# Patient Record
Sex: Female | Born: 1990 | Race: Black or African American | Hispanic: No | Marital: Married | Smoking: Former smoker
Health system: Southern US, Community
[De-identification: ages and names within clinical notes are randomized; demographics above are authoritative.]

## PROBLEM LIST (undated history)

## (undated) DIAGNOSIS — E282 Polycystic ovarian syndrome: Secondary | ICD-10-CM

## (undated) DIAGNOSIS — O139 Gestational [pregnancy-induced] hypertension without significant proteinuria, unspecified trimester: Secondary | ICD-10-CM

## (undated) DIAGNOSIS — E8881 Metabolic syndrome: Secondary | ICD-10-CM

## (undated) DIAGNOSIS — L680 Hirsutism: Secondary | ICD-10-CM

## (undated) DIAGNOSIS — G43909 Migraine, unspecified, not intractable, without status migrainosus: Secondary | ICD-10-CM

## (undated) DIAGNOSIS — O9989 Other specified diseases and conditions complicating pregnancy, childbirth and the puerperium: Secondary | ICD-10-CM

## (undated) DIAGNOSIS — K589 Irritable bowel syndrome without diarrhea: Secondary | ICD-10-CM

## (undated) HISTORY — DX: Metabolic syndrome: E88.810

## (undated) HISTORY — DX: Gestational (pregnancy-induced) hypertension without significant proteinuria, unspecified trimester: O13.9

## (undated) HISTORY — DX: Other specified diseases and conditions complicating pregnancy, childbirth and the puerperium: O99.89

## (undated) HISTORY — DX: Polycystic ovarian syndrome: E28.2

## (undated) HISTORY — DX: Metabolic syndrome: E88.81

## (undated) HISTORY — DX: Hirsutism: L68.0

---

## 2009-02-13 ENCOUNTER — Emergency Department (HOSPITAL_COMMUNITY): Admission: EM | Admit: 2009-02-13 | Discharge: 2009-02-13 | Payer: Self-pay | Admitting: Emergency Medicine

## 2009-07-26 ENCOUNTER — Emergency Department (HOSPITAL_COMMUNITY): Admission: EM | Admit: 2009-07-26 | Discharge: 2009-07-26 | Payer: Self-pay | Admitting: Emergency Medicine

## 2011-01-24 LAB — POCT URINALYSIS DIP (DEVICE)
Glucose, UA: NEGATIVE mg/dL
Nitrite: POSITIVE — AB
Urobilinogen, UA: 0.2 mg/dL (ref 0.0–1.0)

## 2011-01-24 LAB — POCT PREGNANCY, URINE: Preg Test, Ur: NEGATIVE

## 2011-05-29 ENCOUNTER — Emergency Department (HOSPITAL_COMMUNITY)
Admission: EM | Admit: 2011-05-29 | Discharge: 2011-05-29 | Disposition: A | Discharge status: Home / Self Care | Payer: 59 | Attending: Emergency Medicine | Admitting: Emergency Medicine

## 2011-05-29 DIAGNOSIS — R079 Chest pain, unspecified: Secondary | ICD-10-CM | POA: Insufficient documentation

## 2011-05-29 DIAGNOSIS — N644 Mastodynia: Secondary | ICD-10-CM | POA: Insufficient documentation

## 2011-05-29 DIAGNOSIS — E119 Type 2 diabetes mellitus without complications: Secondary | ICD-10-CM | POA: Insufficient documentation

## 2011-05-29 DIAGNOSIS — R51 Headache: Secondary | ICD-10-CM | POA: Insufficient documentation

## 2011-05-29 DIAGNOSIS — Z79899 Other long term (current) drug therapy: Secondary | ICD-10-CM | POA: Insufficient documentation

## 2011-05-29 DIAGNOSIS — R071 Chest pain on breathing: Secondary | ICD-10-CM | POA: Insufficient documentation

## 2011-05-29 LAB — GLUCOSE, CAPILLARY: Glucose-Capillary: 114 mg/dL — ABNORMAL HIGH (ref 70–99)

## 2011-06-05 ENCOUNTER — Inpatient Hospital Stay (INDEPENDENT_AMBULATORY_CARE_PROVIDER_SITE_OTHER)
Admission: RE | Admit: 2011-06-05 | Discharge: 2011-06-05 | Disposition: A | Discharge status: Home / Self Care | Payer: 59 | Source: Ambulatory Visit | Attending: Family Medicine | Admitting: Family Medicine

## 2011-06-05 DIAGNOSIS — J029 Acute pharyngitis, unspecified: Secondary | ICD-10-CM

## 2011-10-22 NOTE — L&D Delivery Note (Signed)
Delivery Note At 2:26 PM a viable female was delivered via Vaginal, Spontaneous Delivery (Presentation: Left Occiput Anterior).  APGAR: 8, 9; weight .  pending Placenta status: Intact, Spontaneous.  Cord: 3 vessels with the following complications: None.  Cord pH: not indicated  Anesthesia:   Episiotomy:  Lacerations: 3rd degree Suture Repair: 3 figure of 8's w/ 4-0 vicryl to L periurethral laceration while red rubber foley in place, 3 separated deep sutures of 2-0 vicryl in P-I-S fashion to anal sphincter,, 3-0 vicryl rapide in standard fashion to midline tear.  Est. Blood Loss (mL): 400  Mom to postpartum.  Baby to nursery-stable.  Crystal Sheppard A. 09/25/2012, 2:57 PM

## 2011-12-27 ENCOUNTER — Inpatient Hospital Stay (HOSPITAL_COMMUNITY): Payer: 59

## 2011-12-27 ENCOUNTER — Encounter (HOSPITAL_COMMUNITY): Payer: Self-pay

## 2011-12-27 ENCOUNTER — Inpatient Hospital Stay (HOSPITAL_COMMUNITY)
Admission: AD | Admit: 2011-12-27 | Discharge: 2011-12-27 | Disposition: A | Discharge status: Home / Self Care | Payer: 59 | Source: Ambulatory Visit | Attending: Obstetrics and Gynecology | Admitting: Obstetrics and Gynecology

## 2011-12-27 DIAGNOSIS — R109 Unspecified abdominal pain: Secondary | ICD-10-CM | POA: Insufficient documentation

## 2011-12-27 DIAGNOSIS — N938 Other specified abnormal uterine and vaginal bleeding: Secondary | ICD-10-CM | POA: Insufficient documentation

## 2011-12-27 DIAGNOSIS — N949 Unspecified condition associated with female genital organs and menstrual cycle: Secondary | ICD-10-CM | POA: Insufficient documentation

## 2011-12-27 HISTORY — DX: Polycystic ovarian syndrome: E28.2

## 2011-12-27 LAB — URINALYSIS, ROUTINE W REFLEX MICROSCOPIC
Bilirubin Urine: NEGATIVE
Ketones, ur: NEGATIVE mg/dL
Nitrite: NEGATIVE
pH: 6.5 (ref 5.0–8.0)

## 2011-12-27 LAB — CBC
HCT: 35.7 % — ABNORMAL LOW (ref 36.0–46.0)
Hemoglobin: 11.6 g/dL — ABNORMAL LOW (ref 12.0–15.0)
MCHC: 32.5 g/dL (ref 30.0–36.0)
WBC: 6.4 10*3/uL (ref 4.0–10.5)

## 2011-12-27 LAB — URINE MICROSCOPIC-ADD ON

## 2011-12-27 MED ORDER — OXYCODONE-ACETAMINOPHEN 5-325 MG PO TABS
1.0000 | ORAL_TABLET | Freq: Once | ORAL | Status: AC
  Administered 2011-12-27: 1 via ORAL
  Filled 2011-12-27: qty 1

## 2011-12-27 NOTE — ED Provider Notes (Signed)
History     Chief Complaint  Patient presents with  . Vaginal Bleeding   HPI Pt is a 21 yo. Felt like she started her period at beginning of week. 2 days ago started having pains in lower abd and pelvis. Looked like period was going away (dark brown) then started having increased bleeding. Started having large blood clots pass. Heavy bleeding continued today. Patient has been having nausea since Wednesday. Periods are irregular. Usually skips a few months. Usually bleeds 5 days, not a lot of pain typically. Last period before now was 2-3 months ago.  OB History    Grav Para Term Preterm Abortions TAB SAB Ect Mult Living   1    1 1           Past Medical History  Diagnosis Date  . Polycystic ovarian disease     History reviewed. No pertinent past surgical history.  Family History  Problem Relation Age of Onset  . Hypertension Mother   . Hypertension Brother   . Diabetes Paternal Grandmother     History  Substance Use Topics  . Smoking status: Former Games developer  . Smokeless tobacco: Not on file  . Alcohol Use: Yes     occasionally    Allergies: No Known Allergies  Prescriptions prior to admission  Medication Sig Dispense Refill  . ibuprofen (ADVIL,MOTRIN) 200 MG tablet Take 800 mg by mouth every 6 (six) hours as needed. Head aches        Review of Systems  Constitutional: Negative for fever and chills.  Respiratory: Negative for shortness of breath.   Cardiovascular: Negative for chest pain.  Gastrointestinal: Negative for nausea and abdominal pain.  Genitourinary: Negative for dysuria.  Musculoskeletal: Positive for back pain.  Skin: Negative for rash.  Neurological: Positive for headaches. Negative for dizziness.   Physical Exam   Blood pressure 157/83, pulse 85, temperature 97.7 F (36.5 C), temperature source Oral, resp. rate 16, height 5' 5.5" (1.664 m), weight 101.515 kg (223 lb 12.8 oz), last menstrual period 12/23/2011, SpO2 100.00%.  Physical Exam    Constitutional: She is oriented to person, place, and time. She appears well-developed and well-nourished. No distress.  HENT:  Head: Normocephalic and atraumatic.  Neck: Normal range of motion.  Cardiovascular: Normal rate and regular rhythm.   No murmur heard. Respiratory: Effort normal and breath sounds normal. She has no wheezes.  GI: Soft.  Musculoskeletal: Normal range of motion. She exhibits no edema and no tenderness.  Lymphadenopathy:    She has no cervical adenopathy.  Neurological: She is alert and oriented to person, place, and time. No cranial nerve deficit.  Skin: Skin is dry. No rash noted.    MAU Course  Procedures Results for orders placed during the hospital encounter of 12/27/11 (from the past 24 hour(s))  URINALYSIS, ROUTINE W REFLEX MICROSCOPIC     Status: Abnormal   Collection Time   12/27/11  5:25 PM      Component Value Range   Color, Urine RED (*) YELLOW    APPearance CLOUDY (*) CLEAR    Specific Gravity, Urine 1.025  1.005 - 1.030    pH 6.5  5.0 - 8.0    Glucose, UA NEGATIVE  NEGATIVE (mg/dL)   Hgb urine dipstick LARGE (*) NEGATIVE    Bilirubin Urine NEGATIVE  NEGATIVE    Ketones, ur NEGATIVE  NEGATIVE (mg/dL)   Protein, ur NEGATIVE  NEGATIVE (mg/dL)   Urobilinogen, UA 0.2  0.0 - 1.0 (mg/dL)   Nitrite  NEGATIVE  NEGATIVE    Leukocytes, UA NEGATIVE  NEGATIVE   URINE MICROSCOPIC-ADD ON     Status: Normal   Collection Time   12/27/11  5:25 PM      Component Value Range   Squamous Epithelial / LPF RARE  RARE    RBC / HPF TOO NUMEROUS TO COUNT  <3 (RBC/hpf)  POCT PREGNANCY, URINE     Status: Normal   Collection Time   12/27/11  5:27 PM      Component Value Range   Preg Test, Ur NEGATIVE  NEGATIVE    MDM Pelvic exam revealed moderate bleeding.  CBC pending Will check non-OB pelvic US Percocet x1 for headache and back ache  Assessment and Plan  21 yo F presenting for vaginal bleeding with occasional abd pain - CBC and vitals within normal limits -  [place] within normal limits - Most likely secondary to dysfunctional uterine bleeding. Patient has irregular periods, this one just seems heavier than usual.  - Will discharge home in stable medical condition.  - Patient will follow up in her Gyn clinic. If her bleeding gets heavier or does not go away, she will be seen earlier. - Plan discussed with Sid Falcon, CNM  Azyiah Bo 12/27/2011, 6:41 PM

## 2011-12-27 NOTE — Progress Notes (Signed)
Patient states she has a history of irregular periods and PCOS. Started bleeding on 3-4 and continues. Has been having abdominal pain on and off, none now, and a bad headache.

## 2011-12-27 NOTE — Discharge Instructions (Signed)
Please call your Ob/GYN provider for an appointment within the next few weeks. If you continue to have heavy bleeding in one week, please see you GYN or return here to be evaluated. You can take Motrin as needed. Abnormal Uterine Bleeding Abnormal uterine bleeding can have many causes. Some cases are simply treated, while others are more serious. There are several kinds of bleeding that is considered abnormal, including:  Bleeding between periods.   Bleeding after sexual intercourse.   Spotting anytime in the menstrual cycle.   Bleeding heavier or more than normal.   Bleeding after menopause.  CAUSES  There are many causes of abnormal uterine bleeding. It can be present in teenagers, pregnant women, women during their reproductive years, and women who have reached menopause. Your caregiver will look for the more common causes depending on your age, signs, symptoms and your particular circumstance. Most cases are not serious and can be treated. Even the more serious causes, like cancer of the female organs, can be treated adequately if found in the early stages. That is why all types of bleeding should be evaluated and treated as soon as possible. DIAGNOSIS  Diagnosing the cause may take several kinds of tests. Your caregiver may:  Take a complete history of the type of bleeding.   Perform a complete physical exam and Pap smear.   Take an ultrasound on the abdomen showing a picture of the female organs and the pelvis.   Inject dye into the uterus and Fallopian tubes and X-ray them (hysterosalpingogram).   Place fluid in the uterus and do an ultrasound (sonohysterogrqphy).   Take a CT scan to examine the female organs and pelvis.   Take an MRI to examine the female organs and pelvis. There is no X-ray involved with this procedure.   Look inside the uterus with a telescope that has a light at the end (hysteroscopy).   Scrap the inside of the uterus to get tissue to examine  (Dilatation and Curettage, D&C).   Look into the pelvis with a telescope that has a light at the end (laparoscopy). This is done through a very small cut (incision) in the abdomen.  TREATMENT  Treatment will depend on the cause of the abnormal bleeding. It can include:  Doing nothing to allow the problem to take care of itself over time.   Hormone treatment.   Birth control pills.   Treating the medical condition causing the problem.   Laparoscopy.   Major or minor surgery   Destroying the lining of the uterus with electrical currant, laser, freezing or heat (uterine ablation).  HOME CARE INSTRUCTIONS   Follow your caregiver's recommendation on how to treat your problem.   See your caregiver if you missed a menstrual period and think you may be pregnant.   If you are bleeding heavily, count the number of pads/tampons you use and how often you have to change them. Tell this to your caregiver.   Avoid sexual intercourse until the problem is controlled.  SEEK MEDICAL CARE IF:   You have any kind of abnormal bleeding mentioned above.   You feel dizzy at times.   You are 21 years old and have not had a menstrual period yet.  SEEK IMMEDIATE MEDICAL CARE IF:   You pass out.   You are changing pads/tampons every 15 to 30 minutes.   You have belly (abdominal) pain.   You have a temperature of 100 F (37.8 C) or higher.   You become sweaty  or weak.   You are passing large blood clots from the vagina.   You start to feel sick to your stomach (nauseous) and throw up (vomit).  Document Released: 10/07/2005 Document Revised: 09/26/2011 Document Reviewed: 03/02/2009 [facility] Patient Information 2012 [place], [place].

## 2011-12-28 LAB — GC/CHLAMYDIA PROBE AMP, GENITAL: Chlamydia, DNA Probe: NEGATIVE

## 2012-01-14 NOTE — ED Provider Notes (Signed)
Attestation of Attending Supervision of Advanced Practitioner: Evaluation and management procedures were performed by the PA/NP/CNM/OB Fellow under my supervision/collaboration. Chart reviewed and agree with management and plan.  Letticia Bhattacharyya V 01/14/2012 1:02 PM

## 2012-05-22 LAB — OB RESULTS CONSOLE GC/CHLAMYDIA: Chlamydia: NEGATIVE

## 2012-05-31 LAB — OB RESULTS CONSOLE RPR: RPR: NONREACTIVE

## 2012-05-31 LAB — OB RESULTS CONSOLE GBS: GBS: POSITIVE

## 2012-09-25 ENCOUNTER — Encounter (HOSPITAL_COMMUNITY): Payer: Self-pay | Admitting: *Deleted

## 2012-09-25 ENCOUNTER — Inpatient Hospital Stay (HOSPITAL_COMMUNITY)
Admission: AD | Admit: 2012-09-25 | Discharge: 2012-09-27 | DRG: 775 | Disposition: A | Discharge status: Home / Self Care | Payer: 59 | Source: Ambulatory Visit | Attending: Obstetrics and Gynecology | Admitting: Obstetrics and Gynecology

## 2012-09-25 ENCOUNTER — Other Ambulatory Visit: Payer: Self-pay | Admitting: Obstetrics & Gynecology

## 2012-09-25 ENCOUNTER — Telehealth (HOSPITAL_COMMUNITY): Payer: Self-pay | Admitting: *Deleted

## 2012-09-25 DIAGNOSIS — O09299 Supervision of pregnancy with other poor reproductive or obstetric history, unspecified trimester: Secondary | ICD-10-CM

## 2012-09-25 DIAGNOSIS — Z22330 Carrier of Group B streptococcus: Secondary | ICD-10-CM

## 2012-09-25 DIAGNOSIS — O9902 Anemia complicating childbirth: Secondary | ICD-10-CM

## 2012-09-25 DIAGNOSIS — D649 Anemia, unspecified: Secondary | ICD-10-CM | POA: Diagnosis present

## 2012-09-25 DIAGNOSIS — O99892 Other specified diseases and conditions complicating childbirth: Secondary | ICD-10-CM | POA: Diagnosis present

## 2012-09-25 LAB — CBC
HCT: 37.4 % (ref 36.0–46.0)
Hemoglobin: 12.4 g/dL (ref 12.0–15.0)
MCV: 81.7 fL (ref 78.0–100.0)
RBC: 4.58 MIL/uL (ref 3.87–5.11)
WBC: 6.9 10*3/uL (ref 4.0–10.5)

## 2012-09-25 LAB — TYPE AND SCREEN: Antibody Screen: NEGATIVE

## 2012-09-25 LAB — ABO/RH: ABO/RH(D): A POS

## 2012-09-25 LAB — COMPREHENSIVE METABOLIC PANEL
AST: 13 U/L (ref 0–37)
Albumin: 2.6 g/dL — ABNORMAL LOW (ref 3.5–5.2)
Alkaline Phosphatase: 178 U/L — ABNORMAL HIGH (ref 39–117)
Chloride: 105 mEq/L (ref 96–112)
Potassium: 3.8 mEq/L (ref 3.5–5.1)
Sodium: 138 mEq/L (ref 135–145)
Total Bilirubin: 0.1 mg/dL — ABNORMAL LOW (ref 0.3–1.2)
Total Protein: 5.8 g/dL — ABNORMAL LOW (ref 6.0–8.3)

## 2012-09-25 MED ORDER — DIPHENHYDRAMINE HCL 25 MG PO CAPS: 25.0000 mg | ORAL_CAPSULE | Freq: Four times a day (QID) | ORAL | Status: DC | PRN

## 2012-09-25 MED ORDER — SIMETHICONE 80 MG PO CHEW: 80.0000 mg | CHEWABLE_TABLET | ORAL | Status: DC | PRN

## 2012-09-25 MED ORDER — IBUPROFEN 600 MG PO TABS
600.0000 mg | ORAL_TABLET | Freq: Four times a day (QID) | ORAL | Status: DC
  Administered 2012-09-25 – 2012-09-27 (×8): 600 mg via ORAL
  Filled 2012-09-25 (×8): qty 1

## 2012-09-25 MED ORDER — BENZOCAINE-MENTHOL 20-0.5 % EX AERO
1.0000 "application " | INHALATION_SPRAY | CUTANEOUS | Status: DC | PRN
  Administered 2012-09-26: 1 via TOPICAL
  Filled 2012-09-25: qty 56

## 2012-09-25 MED ORDER — ACETAMINOPHEN 325 MG PO TABS: 650.0000 mg | ORAL_TABLET | ORAL | Status: DC | PRN

## 2012-09-25 MED ORDER — OXYTOCIN BOLUS FROM INFUSION
500.0000 mL | INTRAVENOUS | Status: DC
Start: 2012-09-25 — End: 2012-09-25
  Administered 2012-09-25: 500 mL via INTRAVENOUS

## 2012-09-25 MED ORDER — PENICILLIN G POTASSIUM 5000000 UNITS IJ SOLR
2.5000 10*6.[IU] | INTRAVENOUS | Status: DC
  Filled 2012-09-25 (×4): qty 2.5

## 2012-09-25 MED ORDER — EPHEDRINE 5 MG/ML INJ: 10.0000 mg | INTRAVENOUS | Status: DC | PRN

## 2012-09-25 MED ORDER — ONDANSETRON HCL 4 MG/2ML IJ SOLN: 4.0000 mg | Freq: Four times a day (QID) | INTRAMUSCULAR | Status: DC | PRN

## 2012-09-25 MED ORDER — BISACODYL 10 MG RE SUPP: 10.0000 mg | Freq: Every day | RECTAL | Status: DC | PRN

## 2012-09-25 MED ORDER — LANOLIN HYDROUS EX OINT: TOPICAL_OINTMENT | CUTANEOUS | Status: DC | PRN

## 2012-09-25 MED ORDER — PHENYLEPHRINE 40 MCG/ML (10ML) SYRINGE FOR IV PUSH (FOR BLOOD PRESSURE SUPPORT)
80.0000 ug | PREFILLED_SYRINGE | INTRAVENOUS | Status: DC | PRN
  Filled 2012-09-25: qty 5

## 2012-09-25 MED ORDER — OXYCODONE-ACETAMINOPHEN 5-325 MG PO TABS
1.0000 | ORAL_TABLET | ORAL | Status: DC | PRN
  Administered 2012-09-25: 2 via ORAL
  Administered 2012-09-26: 1 via ORAL
  Administered 2012-09-27: 2 via ORAL
  Filled 2012-09-25: qty 2
  Filled 2012-09-25 (×2): qty 1

## 2012-09-25 MED ORDER — LACTATED RINGERS IV SOLN
INTRAVENOUS | Status: DC
  Administered 2012-09-25: 13:00:00 via INTRAVENOUS

## 2012-09-25 MED ORDER — DIPHENHYDRAMINE HCL 50 MG/ML IJ SOLN: 12.5000 mg | INTRAMUSCULAR | Status: DC | PRN

## 2012-09-25 MED ORDER — DIBUCAINE 1 % RE OINT: 1.0000 "application " | TOPICAL_OINTMENT | RECTAL | Status: DC | PRN

## 2012-09-25 MED ORDER — OXYCODONE-ACETAMINOPHEN 5-325 MG PO TABS: 1.0000 | ORAL_TABLET | ORAL | Status: DC | PRN

## 2012-09-25 MED ORDER — EPHEDRINE 5 MG/ML INJ
10.0000 mg | INTRAVENOUS | Status: DC | PRN
  Filled 2012-09-25: qty 4

## 2012-09-25 MED ORDER — SENNOSIDES-DOCUSATE SODIUM 8.6-50 MG PO TABS
2.0000 | ORAL_TABLET | Freq: Every day | ORAL | Status: DC
  Administered 2012-09-25 – 2012-09-26 (×2): 2 via ORAL

## 2012-09-25 MED ORDER — WITCH HAZEL-GLYCERIN EX PADS: 1.0000 "application " | MEDICATED_PAD | CUTANEOUS | Status: DC | PRN

## 2012-09-25 MED ORDER — ONDANSETRON HCL 4 MG PO TABS: 4.0000 mg | ORAL_TABLET | ORAL | Status: DC | PRN

## 2012-09-25 MED ORDER — PENICILLIN G POTASSIUM 5000000 UNITS IJ SOLR
5.0000 10*6.[IU] | Freq: Once | INTRAVENOUS | Status: AC
  Administered 2012-09-25: 5 10*6.[IU] via INTRAVENOUS
  Filled 2012-09-25: qty 5

## 2012-09-25 MED ORDER — ONDANSETRON HCL 4 MG/2ML IJ SOLN: 4.0000 mg | INTRAMUSCULAR | Status: DC | PRN

## 2012-09-25 MED ORDER — CITRIC ACID-SODIUM CITRATE 334-500 MG/5ML PO SOLN: 30.0000 mL | ORAL | Status: DC | PRN

## 2012-09-25 MED ORDER — IBUPROFEN 600 MG PO TABS: 600.0000 mg | ORAL_TABLET | Freq: Four times a day (QID) | ORAL | Status: DC | PRN

## 2012-09-25 MED ORDER — LIDOCAINE HCL (PF) 1 % IJ SOLN
30.0000 mL | INTRAMUSCULAR | Status: DC | PRN
  Filled 2012-09-25: qty 30

## 2012-09-25 MED ORDER — LACTATED RINGERS IV SOLN
500.0000 mL | INTRAVENOUS | Status: DC | PRN
  Administered 2012-09-25: 1000 mL via INTRAVENOUS

## 2012-09-25 MED ORDER — FENTANYL 2.5 MCG/ML BUPIVACAINE 1/10 % EPIDURAL INFUSION (WH - ANES)
14.0000 mL/h | INTRAMUSCULAR | Status: DC
  Filled 2012-09-25: qty 125

## 2012-09-25 MED ORDER — LACTATED RINGERS IV SOLN: 500.0000 mL | Freq: Once | INTRAVENOUS | Status: DC

## 2012-09-25 MED ORDER — FLEET ENEMA 7-19 GM/118ML RE ENEM: 1.0000 | ENEMA | Freq: Every day | RECTAL | Status: DC | PRN

## 2012-09-25 MED ORDER — TETANUS-DIPHTH-ACELL PERTUSSIS 5-2.5-18.5 LF-MCG/0.5 IM SUSP: 0.5000 mL | Freq: Once | INTRAMUSCULAR | Status: DC

## 2012-09-25 MED ORDER — PRENATAL MULTIVITAMIN CH
1.0000 | ORAL_TABLET | Freq: Every day | ORAL | Status: DC
  Administered 2012-09-25 – 2012-09-27 (×3): 1 via ORAL
  Filled 2012-09-25 (×3): qty 1

## 2012-09-25 MED ORDER — OXYTOCIN 40 UNITS IN LACTATED RINGERS INFUSION - SIMPLE MED
62.5000 mL/h | INTRAVENOUS | Status: DC
Start: 2012-09-25 — End: 2012-09-25
  Administered 2012-09-25: 62.5 mL/h via INTRAVENOUS
  Filled 2012-09-25: qty 1000

## 2012-09-25 MED ORDER — PHENYLEPHRINE 40 MCG/ML (10ML) SYRINGE FOR IV PUSH (FOR BLOOD PRESSURE SUPPORT): 80.0000 ug | PREFILLED_SYRINGE | INTRAVENOUS | Status: DC | PRN

## 2012-09-25 MED ORDER — ZOLPIDEM TARTRATE 5 MG PO TABS: 5.0000 mg | ORAL_TABLET | Freq: Every evening | ORAL | Status: DC | PRN

## 2012-09-25 NOTE — MAU Note (Signed)
Patient sent from the office for direct admit for labor. Will hold in MAU for now. Patient states contractions every 5-7 minutes. Was 4-5 cm 90 at -1 in the office. Needs PCN for POS GBS.

## 2012-09-25 NOTE — H&P (Signed)
Crystal Sheppard is a 21 y.o. G2P0010 at [redacted]w[redacted]d presenting for active. Pt notes onset contractions around 9am and have increased in strength and timing . Good fetal movement, No vaginal bleeding, not leaking fluid.  P[facility] at [facility] since 22 wks - late to care at 22 wks - genetic screening not done - GBS bacteruria - borderline bps. No evidence PEC - borderline AFI- around 8's through 3rd trimester, otherwise reactive testing. Growth at 37 wks- 59%   Prenatal Transfer Tool  Maternal Diabetes: No Genetic Screening: Declined Maternal Ultrasounds/Referrals: Normal Fetal Ultrasounds or other Referrals:  None Maternal Substance Abuse:  No Significant Maternal Medications:  None Significant Maternal Lab Results: None     OB History    Grav Para Term Preterm Abortions TAB SAB Ect Mult Living   2    1 1     0    prior TAB Meds: iron, PNV  Past Medical History  Diagnosis Date  . Polycystic ovarian disease   . Hirsutism   . Other specified complication, antepartum     headaches  . Dysmetabolic syndrome X   . Polycystic ovaries    History reviewed. No pertinent past surgical history. Family History: family history includes Diabetes in her paternal grandmother; Hypertension in her brother and mother; and Mental retardation in her cousin. Social History:  reports that she has quit smoking. She does not have any smokeless tobacco history on file. She reports that she drinks alcohol. She reports that she does not use illicit drugs.  Review of Systems - Negative except contractions, light bloody flow   Dilation:  (deferred, done in office) Blood pressure 150/96, pulse 83, temperature 97.7 F (36.5 C), temperature source Oral, resp. rate 18, height 5\' 6"  (1.676 m), weight 109.589 kg (241 lb 9.6 oz), last menstrual period 12/23/2011, SpO2 100.00%.  Physical Exam: uncomfortable with contractions Gen: well appearing, no distress CV: RRR Pulm: CTAB Back: no CVAT Abd: gravid,  NT, no RUQ pain. EFW 8# LE: trace edema, equal bilaterally, non-tender  Cvx: 4-5cm/ 90%/ -1, vtx  Prenatal labs: ABO, Rh: A/Positive/-- (08/11 0000) Antibody: Negative (08/11 0000) Rubella:  immune RPR: Nonreactive (08/11 0000)  HBsAg: Negative (08/11 0000)  HIV: Non-reactive (08/11 0000)  GBS: Positive (08/11 0000)  1 hr Glucola nl  Genetic screening not done Anatomy US nl SS neg   Assessment/Plan: 21 y.o. G2P0010 at [redacted]w[redacted]d Active labor, expectant management GBS pos, plan PCN Borderline bp's check PIH labs, no sx, watch closely   Crystal Sheppard A. 09/25/2022, 12:24 PM

## 2012-09-25 NOTE — Telephone Encounter (Signed)
Preadmission screen  

## 2012-09-26 DIAGNOSIS — O09299 Supervision of pregnancy with other poor reproductive or obstetric history, unspecified trimester: Secondary | ICD-10-CM

## 2012-09-26 DIAGNOSIS — O9902 Anemia complicating childbirth: Secondary | ICD-10-CM | POA: Diagnosis not present

## 2012-09-26 LAB — CBC
Hemoglobin: 10.9 g/dL — ABNORMAL LOW (ref 12.0–15.0)
MCHC: 32.9 g/dL (ref 30.0–36.0)
RBC: 4.07 MIL/uL (ref 3.87–5.11)
WBC: 8.8 10*3/uL (ref 4.0–10.5)

## 2012-09-26 LAB — RPR: RPR Ser Ql: NONREACTIVE

## 2012-09-26 MED ORDER — POLYSACCHARIDE IRON COMPLEX 150 MG PO CAPS
150.0000 mg | ORAL_CAPSULE | Freq: Every day | ORAL | Status: DC
  Administered 2012-09-26 – 2012-09-27 (×2): 150 mg via ORAL
  Filled 2012-09-26 (×3): qty 1

## 2012-09-26 NOTE — Progress Notes (Signed)
Post Partum Day 1 NSVD of viable female infant - over 3rd degree laceration Subjective: no complaints, up ad lib without syncope, voiding, tolerating PO, + flatus, +BM  Pain well controlled with po meds, taking motrin and percocet  BF on demand Mood stable, bonding well   Objective: Blood pressure 143/89, pulse 96, temperature 98.2 F (36.8 C), temperature source Oral, resp. rate 20, height 5\' 6"  (1.676 m), weight 109.317 kg (241 lb), last menstrual period 12/23/2011, SpO2 99.00%, unknown if currently breastfeeding.  Physical Exam:  General: alert, cooperative and no distress Lungs: CTAB Heart: RRR Lochia: appropriate Uterine Fundus: firm Perineum: slight swelling but healing well DVT Evaluation: No evidence of DVT seen on physical exam. Negative Homan's sign. No cords or calf tenderness. No significant calf/ankle edema.   Basename 09/26/12 0506 09/25/12 1225  HGB 10.9* 12.4  HCT 33.1* 37.4   Anemia of pregnancy  to commenced on Niferex 150mg  po daily  Assessment/Plan: Plan for discharge tomorrow       LOS: 1 day   Crystal Sheppard 09/26/2012, 12:21 PM

## 2012-09-27 MED ORDER — POLYSACCHARIDE IRON COMPLEX 150 MG PO CAPS: 150.0000 mg | ORAL_CAPSULE | Freq: Every day | ORAL | Status: DC

## 2012-09-27 MED ORDER — OXYCODONE-ACETAMINOPHEN 5-325 MG PO TABS: 1.0000 | ORAL_TABLET | Freq: Four times a day (QID) | ORAL | Status: DC | PRN

## 2012-09-27 MED ORDER — IBUPROFEN 600 MG PO TABS: 600.0000 mg | ORAL_TABLET | Freq: Four times a day (QID) | ORAL | Status: DC | PRN

## 2012-09-27 MED ORDER — SIMETHICONE 80 MG PO CHEW: 80.0000 mg | CHEWABLE_TABLET | ORAL | Status: DC | PRN

## 2012-09-27 MED ORDER — SENNOSIDES-DOCUSATE SODIUM 8.6-50 MG PO TABS: 2.0000 | ORAL_TABLET | Freq: Every day | ORAL | Status: DC

## 2012-09-27 NOTE — Progress Notes (Signed)
Post Partum Day 2 NSVD viable female over 3rd degree laceration, Subjective: no complaints, up ad lib without syncope, voiding, tolerating PO, + flatus, +BM  Pain well controlled with po meds, taking motrin and percocet  BF; on demand Mood stable, bonding well.    Objective: Blood pressure 130/84, pulse 94, temperature 97.8 F (36.6 C), temperature source Oral, resp. rate 18, height 5\' 6"  (1.676 m), weight 109.317 kg (241 lb), last menstrual period 12/23/2011, SpO2 99.00%, unknown if currently breastfeeding.  Physical Exam:  General: alert, cooperative and no distress Breasts: N/T Lungs: CTAB Heart: RRR Lochia: appropriate, min, Rubra Uterine Fundus: firm at -2/u Perineum: 3rd degree - healing well DVT Evaluation: No evidence of DVT seen on physical exam. Negative Homan's sign. No cords or calf tenderness. No significant calf/ankle edema.   Basename 09/26/12 0506 09/25/12 1225  HGB 10.9* 12.4  HCT 33.1* 37.4   Anemia of pregnancy Continues on po iron Supplement  Assessment/Plan: Discharge home F/u [facility] x 6 weeks or as needed.      LOS: 2 days   Tyleek Smick 09/27/2012, 9:34 AM

## 2012-09-27 NOTE — Discharge Summary (Signed)
Physician Discharge Summary  Patient ID: Crystal Sheppard MRN: [id number] DOB/AGE: Sep 20, 1991 21 y.o.  Admit date: 09/25/2012 Discharge date: 09/27/2012  Admission Diagnoses: spontaneous onset of labor at [redacted]w[redacted]d   Discharge Diagnoses:  Active Problems:  NSVD (normal spontaneous vaginal delivery)  Hx of maternal laceration, 3rd degree, currently pregnant  Anemia of mother in pregnancy, delivered   Discharged Condition: stable  Hospital Course: uncomplicated course  Consults: None  Significant Diagnostic Studies: labs:antenatal rountine - normal, microbiology: GBS - positive and radiology: Ultrasound: - anatomy normal and ahd growth f/u sono 3rd trimester - normal growth  Treatments: IV hydration, antibiotics: penicillin and analgesia: acetaminophen w/ codeine and ibuprofen  Discharge Exam: Blood pressure 130/84, pulse 94, temperature 97.8 F (36.6 C), temperature source Oral, resp. rate 18, height 5\' 6"  (1.676 m), weight 109.317 kg (241 lb), last menstrual period 12/23/2011, SpO2 99.00%, unknown if currently breastfeeding.  ROS:  General appearance: alert, cooperative and no distress Affect: AAO x 3 Breasts: Nipples slightly tender Lungs: CTAB CV: RRR Abdomen: Soft, B/S x 4, no BM as yet - advised re stool softener  Use as 3rd degree laceration. Fundus: -2/u, firm Lochia: Min, Rubra GU: no problems voiding GI: normal Extremities: No edema/ No swelling bilaterally.  Disposition: 01-Home or Self Care  Discharge Orders    Future Orders Please Complete By Expires   Diet - low sodium heart healthy      Diet general      Discharge instructions      Comments:   Per [facility] booklet       Medication List     As of 09/27/2012  9:41 AM    STOP taking these medications         acetaminophen 500 MG tablet   Commonly known as: TYLENOL      ferrous sulfate 325 (65 FE) MG tablet      TAKE these medications         ibuprofen 600 MG tablet   Commonly known as:  ADVIL,MOTRIN   Take 1 tablet (600 mg total) by mouth every 6 (six) hours as needed for pain.      iron polysaccharides 150 MG capsule   Commonly known as: NIFEREX   Take 1 capsule (150 mg total) by mouth daily.      oxyCODONE-acetaminophen 5-325 MG per tablet   Commonly known as: PERCOCET/ROXICET   Take 1-2 tablets by mouth every 6 (six) hours as needed (moderate - severe pain).      prenatal multivitamin Tabs   Take 1 tablet by mouth daily.      senna-docusate 8.6-50 MG per tablet   Commonly known as: Senokot-S   Take 2 tablets by mouth at bedtime.      simethicone 80 MG chewable tablet   Commonly known as: MYLICON   Chew 1 tablet (80 mg total) by mouth as needed for flatulence.           Follow-up Information    Follow up with [facility] In 6 weeks. (As needed)    Contact information:   [address] [place] [address] [phone]        NSVD viable female " Crystal Sheppard" - 3rd degree laceration.  Signed: Earl Gala, CNM. 09/27/2012, 9:41 AM

## 2012-09-28 ENCOUNTER — Inpatient Hospital Stay (HOSPITAL_COMMUNITY): Admission: RE | Admit: 2012-09-28 | Payer: 59 | Source: Ambulatory Visit

## 2013-09-15 ENCOUNTER — Encounter: Payer: Self-pay | Admitting: Family Medicine

## 2013-09-15 ENCOUNTER — Ambulatory Visit: Payer: Self-pay | Admitting: Family Medicine

## 2014-03-29 ENCOUNTER — Emergency Department (HOSPITAL_COMMUNITY)
Admission: EM | Admit: 2014-03-29 | Discharge: 2014-03-30 | Disposition: A | Discharge status: Home / Self Care | Payer: 59 | Attending: Emergency Medicine | Admitting: Emergency Medicine

## 2014-03-29 ENCOUNTER — Encounter (HOSPITAL_COMMUNITY): Payer: Self-pay | Admitting: Emergency Medicine

## 2014-03-29 ENCOUNTER — Emergency Department (HOSPITAL_COMMUNITY): Payer: 59

## 2014-03-29 DIAGNOSIS — Z862 Personal history of diseases of the blood and blood-forming organs and certain disorders involving the immune mechanism: Secondary | ICD-10-CM | POA: Insufficient documentation

## 2014-03-29 DIAGNOSIS — Z87891 Personal history of nicotine dependence: Secondary | ICD-10-CM | POA: Insufficient documentation

## 2014-03-29 DIAGNOSIS — Z8639 Personal history of other endocrine, nutritional and metabolic disease: Secondary | ICD-10-CM | POA: Insufficient documentation

## 2014-03-29 DIAGNOSIS — R079 Chest pain, unspecified: Secondary | ICD-10-CM | POA: Insufficient documentation

## 2014-03-29 DIAGNOSIS — Z872 Personal history of diseases of the skin and subcutaneous tissue: Secondary | ICD-10-CM | POA: Insufficient documentation

## 2014-03-29 LAB — I-STAT CHEM 8, ED
BUN: 8 mg/dL (ref 6–23)
CALCIUM ION: 1.22 mmol/L (ref 1.12–1.23)
Chloride: 107 mEq/L (ref 96–112)
Creatinine, Ser: 0.6 mg/dL (ref 0.50–1.10)
Glucose, Bld: 100 mg/dL — ABNORMAL HIGH (ref 70–99)
HEMATOCRIT: 43 % (ref 36.0–46.0)
HEMOGLOBIN: 14.6 g/dL (ref 12.0–15.0)
Potassium: 3.3 mEq/L — ABNORMAL LOW (ref 3.7–5.3)
SODIUM: 142 meq/L (ref 137–147)
TCO2: 24 mmol/L (ref 0–100)

## 2014-03-29 LAB — I-STAT TROPONIN, ED: TROPONIN I, POC: 0 ng/mL (ref 0.00–0.08)

## 2014-03-29 MED ORDER — ACETAMINOPHEN 325 MG PO TABS
650.0000 mg | ORAL_TABLET | Freq: Once | ORAL | Status: AC
  Administered 2014-03-29: 650 mg via ORAL
  Filled 2014-03-29: qty 2

## 2014-03-29 NOTE — ED Notes (Signed)
New onset chest pain, starting today. Patient was at work when first noticed, no strenuous activity endorsed.

## 2014-03-30 LAB — I-STAT TROPONIN, ED: TROPONIN I, POC: 0.02 ng/mL (ref 0.00–0.08)

## 2014-03-30 MED ORDER — FAMOTIDINE 20 MG PO TABS
20.0000 mg | ORAL_TABLET | Freq: Once | ORAL | Status: AC
  Filled 2014-03-30: qty 1

## 2014-03-30 NOTE — Discharge Instructions (Signed)
Tried ibuprofen and Prilosec for pain. Return if you get symptoms when he exerts herself, you pass out or worsening symptoms.  Results and differential diagnosis were discussed with the patient/parent/guardian. Close follow up outpatient was discussed, comfortable with the plan.   If you were given medicines take as directed.  If you are on coumadin or contraceptives realize their levels and effectiveness is altered by many different medicines.  If you have any reaction (rash, tongues swelling, other) to the medicines stop taking and see a physician.   Please follow up as directed and return to the ER or see a physician for new or worsening symptoms.  Thank you. Filed Vitals:   03/29/14 2300 03/29/14 2315 03/29/14 2355 03/30/14 0000  BP: 140/79  139/75 129/81  Pulse: 91 97 93 91  Temp:   98.1 F (36.7 C)   TempSrc:   Oral   Resp: 13 19 14 22   Height:      Weight:      SpO2: 100% 100% 100% 100%     Chest Pain (Nonspecific) It is often hard to give a specific diagnosis for the cause of chest pain. There is always a chance that your pain could be related to something serious, such as a heart attack or a blood clot in the lungs. You need to follow up with your caregiver for further evaluation. CAUSES   Heartburn.  Pneumonia or bronchitis.  Anxiety or stress.  Inflammation around your heart (pericarditis) or lung (pleuritis or pleurisy).  A blood clot in the lung.  A collapsed lung (pneumothorax). It can develop suddenly on its own (spontaneous pneumothorax) or from injury (trauma) to the chest.  Shingles infection (herpes zoster virus). The chest wall is composed of bones, muscles, and cartilage. Any of these can be the source of the pain.  The bones can be bruised by injury.  The muscles or cartilage can be strained by coughing or overwork.  The cartilage can be affected by inflammation and become sore (costochondritis). DIAGNOSIS  Lab tests or other studies, such as  X-rays, electrocardiography, stress testing, or cardiac imaging, may be needed to find the cause of your pain.  TREATMENT   Treatment depends on what may be causing your chest pain. Treatment may include:  Acid blockers for heartburn.  Anti-inflammatory medicine.  Pain medicine for inflammatory conditions.  Antibiotics if an infection is present.  You may be advised to change lifestyle habits. This includes stopping smoking and avoiding alcohol, caffeine, and chocolate.  You may be advised to keep your head raised (elevated) when sleeping. This reduces the chance of acid going backward from your stomach into your esophagus.  Most of the time, nonspecific chest pain will improve within 2 to 3 days with rest and mild pain medicine. HOME CARE INSTRUCTIONS   If antibiotics were prescribed, take your antibiotics as directed. Finish them even if you start to feel better.  For the next few days, avoid physical activities that bring on chest pain. Continue physical activities as directed.  Do not smoke.  Avoid drinking alcohol.  Only take over-the-counter or prescription medicine for pain, discomfort, or fever as directed by your caregiver.  Follow your caregiver's suggestions for further testing if your chest pain does not go away.  Keep any follow-up appointments you made. If you do not go to an appointment, you could develop lasting (chronic) problems with pain. If there is any problem keeping an appointment, you must call to reschedule. SEEK MEDICAL CARE IF:  You think you are having problems from the medicine you are taking. Read your medicine instructions carefully.  Your chest pain does not go away, even after treatment.  You develop a rash with blisters on your chest. SEEK IMMEDIATE MEDICAL CARE IF:   You have increased chest pain or pain that spreads to your arm, neck, jaw, back, or abdomen.  You develop shortness of breath, an increasing cough, or you are coughing up  blood.  You have severe back or abdominal pain, feel nauseous, or vomit.  You develop severe weakness, fainting, or chills.  You have a fever. THIS IS AN EMERGENCY. Do not wait to see if the pain will go away. Get medical help at once. Call your local emergency services (911 in [place] Do not drive yourself to the hospital. MAKE SURE YOU:   Understand these instructions.  Will watch your condition.  Will get help right away if you are not doing well or get worse. Document Released: 07/17/2005 Document Revised: 12/30/2011 Document Reviewed: 05/12/2008 [facility] Patient Information 2014 [place], [place].

## 2014-03-30 NOTE — ED Provider Notes (Signed)
CSN: [id number]     Arrival date & time 03/29/14  2222 History   First MD Initiated Contact with Patient 03/29/14 2258     Chief Complaint  Patient presents with  . Chest Pain     (Consider location/radiation/quality/duration/timing/severity/associated sxs/prior Treatment) HPI Comments: 23 year old female with no significant echo history presents with left-sided chest pain worse with palpation left upper chest. Patient has had this throughout the day intermittent. Patient has had constant chest pain since before 7:00 tonight. Patient denies cardiac or blood clot risk factors.Patient denies blood clot history, active cancer, recent major trauma or surgery, unilateral leg swelling/ pain, recent long travel, hemoptysis or oral contraceptives. No exertional or diaphoretic symptoms. No injuries. Nonradiating. Significant other had similar episode after eating a meal. No known reflux history.   Patient is a 23 y.o. female presenting with chest pain. The history is provided by the patient.  Chest Pain Associated symptoms: no abdominal pain, no back pain, no fever, no headache, no shortness of breath and not vomiting     Past Medical History  Diagnosis Date  . Polycystic ovarian disease   . Hirsutism   . Other specified complication, antepartum(646.83)     headaches  . Dysmetabolic syndrome X   . Polycystic ovaries    History reviewed. No pertinent past surgical history. Family History  Problem Relation Age of Onset  . Hypertension Mother   . Hypertension Brother   . Diabetes Paternal Grandmother   . Mental retardation Cousin     downs   History  Substance Use Topics  . Smoking status: Former Games developer  . Smokeless tobacco: Not on file  . Alcohol Use: Yes     Comment: occasionally   OB History   Grav Para Term Preterm Abortions TAB SAB Ect Mult Living   2 1 1  1 1    1      Review of Systems  Constitutional: Negative for fever and chills.  HENT: Negative for congestion.   Eyes:  Negative for visual disturbance.  Respiratory: Negative for shortness of breath.   Cardiovascular: Positive for chest pain. Negative for leg swelling.  Gastrointestinal: Negative for vomiting and abdominal pain.  Genitourinary: Negative for dysuria and flank pain.  Musculoskeletal: Negative for back pain, neck pain and neck stiffness.  Skin: Negative for rash.  Neurological: Negative for light-headedness and headaches.      Allergies  Review of patient's allergies indicates no known allergies.  Home Medications   Prior to Admission medications   Medication Sig Start Date End Date Taking? Authorizing Provider  aspirin-acetaminophen-caffeine (EXCEDRIN MIGRAINE) [phone] MG per tablet Take 1 tablet by mouth every 6 (six) hours as needed for headache.   Yes Historical Provider, MD  ibuprofen (ADVIL,MOTRIN) 600 MG tablet Take 1 tablet (600 mg total) by mouth every 6 (six) hours as needed for pain. 09/27/12  Yes Earl Gala, CNM   BP 129/81  Pulse 91  Temp(Src) 98.1 F (36.7 C) (Oral)  Resp 22  Ht 5\' 6"  (1.676 m)  Wt 226 lb (102.513 kg)  BMI 36.49 kg/m2  SpO2 100%  LMP 03/25/2014  Breastfeeding? No Physical Exam  Nursing note and vitals reviewed. Constitutional: She is oriented to person, place, and time. She appears well-developed and well-nourished.  HENT:  Head: Normocephalic and atraumatic.  Eyes: Conjunctivae are normal. Right eye exhibits no discharge. Left eye exhibits no discharge.  Neck: Normal range of motion. Neck supple. No tracheal deviation present.  Cardiovascular: Normal rate, regular rhythm and intact  distal pulses.   Pulmonary/Chest: Effort normal and breath sounds normal.  Abdominal: Soft. She exhibits no distension. There is no tenderness. There is no guarding.  Musculoskeletal: She exhibits no edema and no tenderness.  Neurological: She is alert and oriented to person, place, and time.  Skin: Skin is warm. No rash noted.  Psychiatric: She has a normal  mood and affect.    ED Course  Procedures (including critical care time) Labs Review Labs Reviewed  I-STAT CHEM 8, ED - Abnormal; Notable for the following:    Potassium 3.3 (*)    Glucose, Bld 100 (*)    All other components within normal limits  I-STAT TROPOININ, ED  POC URINE PREG, ED  Rosezena SensorI-STAT TROPOININ, ED    Imaging Review Dg Chest 2 View  03/29/2014   CLINICAL DATA:  Chest pain.  Polycystic ovary disease.  EXAM: CHEST  2 VIEW  COMPARISON:  None.  FINDINGS: The heart size and mediastinal contours are within normal limits. Both lungs are clear. The visualized skeletal structures are unremarkable.  IMPRESSION: No active cardiopulmonary disease.   Electronically Signed   By: Davonna BellingJohn  Curnes M.D.   On: 03/29/2014 23:55     EKG Interpretation None     EKG reviewed heart rate 96, no acute ST elevation or ST depression, mild prolonged QT 48, normal axis, sinus. MDM   Final diagnoses:  Chest pain   Well-appearing female with no cardiac or PE risk factors. Atypical chest pain with unremarkable EKG, chest x-ray unremarkable both reviewed. Screening troponin negative and patient has had greater than 5 hours of continuous atypical chest pain. Followup outpatient discussed. Ibuprofen and Pepcid for pain.  Results and differential diagnosis were discussed with the patient/parent/guardian. Close follow up outpatient was discussed, comfortable with the plan.   Medications  acetaminophen (TYLENOL) tablet 650 mg (650 mg Oral Given 03/29/14 2353)    Filed Vitals:   03/29/14 2300 03/29/14 2315 03/29/14 2355 03/30/14 0000  BP: 140/79  139/75 129/81  Pulse: 91 97 93 91  Temp:   98.1 F (36.7 C)   TempSrc:   Oral   Resp: 13 19 14 22   Height:      Weight:      SpO2: 100% 100% 100% 100%         Enid SkeensJoshua M Vernetta Dizdarevic, MD 04/04/14 0050

## 2014-08-04 ENCOUNTER — Emergency Department (HOSPITAL_COMMUNITY): Payer: 59

## 2014-08-04 ENCOUNTER — Encounter (HOSPITAL_COMMUNITY): Payer: Self-pay | Admitting: Emergency Medicine

## 2014-08-04 ENCOUNTER — Emergency Department (HOSPITAL_COMMUNITY)
Admission: EM | Admit: 2014-08-04 | Discharge: 2014-08-04 | Disposition: A | Discharge status: Home / Self Care | Payer: 59 | Attending: Emergency Medicine | Admitting: Emergency Medicine

## 2014-08-04 DIAGNOSIS — R1013 Epigastric pain: Secondary | ICD-10-CM | POA: Insufficient documentation

## 2014-08-04 DIAGNOSIS — R0602 Shortness of breath: Secondary | ICD-10-CM | POA: Diagnosis present

## 2014-08-04 DIAGNOSIS — R0781 Pleurodynia: Secondary | ICD-10-CM | POA: Diagnosis not present

## 2014-08-04 DIAGNOSIS — R7989 Other specified abnormal findings of blood chemistry: Secondary | ICD-10-CM | POA: Insufficient documentation

## 2014-08-04 DIAGNOSIS — Z8639 Personal history of other endocrine, nutritional and metabolic disease: Secondary | ICD-10-CM | POA: Diagnosis not present

## 2014-08-04 DIAGNOSIS — R1011 Right upper quadrant pain: Secondary | ICD-10-CM | POA: Diagnosis not present

## 2014-08-04 DIAGNOSIS — Z872 Personal history of diseases of the skin and subcutaneous tissue: Secondary | ICD-10-CM | POA: Insufficient documentation

## 2014-08-04 DIAGNOSIS — Z3202 Encounter for pregnancy test, result negative: Secondary | ICD-10-CM | POA: Insufficient documentation

## 2014-08-04 DIAGNOSIS — Z87891 Personal history of nicotine dependence: Secondary | ICD-10-CM | POA: Insufficient documentation

## 2014-08-04 LAB — I-STAT TROPONIN, ED
TROPONIN I, POC: 0.02 ng/mL (ref 0.00–0.08)
TROPONIN I, POC: 0.02 ng/mL (ref 0.00–0.08)

## 2014-08-04 LAB — CBC WITH DIFFERENTIAL/PLATELET
BASOS ABS: 0 10*3/uL (ref 0.0–0.1)
BASOS PCT: 0 % (ref 0–1)
EOS ABS: 0.1 10*3/uL (ref 0.0–0.7)
Eosinophils Relative: 2 % (ref 0–5)
HCT: 37.7 % (ref 36.0–46.0)
Hemoglobin: 12.6 g/dL (ref 12.0–15.0)
Lymphocytes Relative: 28 % (ref 12–46)
Lymphs Abs: 1.7 10*3/uL (ref 0.7–4.0)
MCH: 27.4 pg (ref 26.0–34.0)
MCHC: 33.4 g/dL (ref 30.0–36.0)
MCV: 82 fL (ref 78.0–100.0)
Monocytes Absolute: 0.8 10*3/uL (ref 0.1–1.0)
Monocytes Relative: 12 % (ref 3–12)
NEUTROS ABS: 3.6 10*3/uL (ref 1.7–7.7)
NEUTROS PCT: 58 % (ref 43–77)
PLATELETS: 258 10*3/uL (ref 150–400)
RBC: 4.6 MIL/uL (ref 3.87–5.11)
RDW: 12.5 % (ref 11.5–15.5)
WBC: 6.2 10*3/uL (ref 4.0–10.5)

## 2014-08-04 LAB — URINALYSIS, ROUTINE W REFLEX MICROSCOPIC
Bilirubin Urine: NEGATIVE
GLUCOSE, UA: NEGATIVE mg/dL
KETONES UR: NEGATIVE mg/dL
NITRITE: POSITIVE — AB
PROTEIN: NEGATIVE mg/dL
Specific Gravity, Urine: 1.015 (ref 1.005–1.030)
Urobilinogen, UA: 0.2 mg/dL (ref 0.0–1.0)
pH: 6.5 (ref 5.0–8.0)

## 2014-08-04 LAB — COMPREHENSIVE METABOLIC PANEL
ALBUMIN: 3.6 g/dL (ref 3.5–5.2)
ALK PHOS: 78 U/L (ref 39–117)
ALT: 21 U/L (ref 0–35)
ANION GAP: 12 (ref 5–15)
AST: 15 U/L (ref 0–37)
BILIRUBIN TOTAL: 0.3 mg/dL (ref 0.3–1.2)
BUN: 11 mg/dL (ref 6–23)
CHLORIDE: 107 meq/L (ref 96–112)
CO2: 23 mEq/L (ref 19–32)
CREATININE: 0.61 mg/dL (ref 0.50–1.10)
Calcium: 9.2 mg/dL (ref 8.4–10.5)
GFR calc Af Amer: >90 mL/min (ref >90)
GFR calc non Af Amer: >90 mL/min (ref >90)
Glucose, Bld: 101 mg/dL — ABNORMAL HIGH (ref 70–99)
POTASSIUM: 4 meq/L (ref 3.7–5.3)
Sodium: 142 mEq/L (ref 137–147)
TOTAL PROTEIN: 7.2 g/dL (ref 6.0–8.3)

## 2014-08-04 LAB — URINE MICROSCOPIC-ADD ON

## 2014-08-04 LAB — PREGNANCY, URINE: PREG TEST UR: NEGATIVE

## 2014-08-04 LAB — LIPASE, BLOOD: LIPASE: 24 U/L (ref 11–59)

## 2014-08-04 LAB — D-DIMER, QUANTITATIVE: D-Dimer, Quant: 11.33 ug/mL-FEU — ABNORMAL HIGH (ref 0.00–0.48)

## 2014-08-04 MED ORDER — HYDROMORPHONE HCL 1 MG/ML IJ SOLN
1.0000 mg | Freq: Once | INTRAMUSCULAR | Status: AC
  Administered 2014-08-04: 1 mg via INTRAVENOUS
  Filled 2014-08-04: qty 1

## 2014-08-04 MED ORDER — CEPHALEXIN 500 MG PO CAPS: 500.0000 mg | ORAL_CAPSULE | Freq: Four times a day (QID) | ORAL | Status: DC

## 2014-08-04 MED ORDER — IOHEXOL 350 MG/ML SOLN
80.0000 mL | Freq: Once | INTRAVENOUS | Status: AC | PRN
  Administered 2014-08-04: 80 mL via INTRAVENOUS

## 2014-08-04 MED ORDER — MORPHINE SULFATE 4 MG/ML IJ SOLN
4.0000 mg | Freq: Once | INTRAMUSCULAR | Status: AC
  Administered 2014-08-04: 4 mg via INTRAVENOUS
  Filled 2014-08-04: qty 1

## 2014-08-04 MED ORDER — SODIUM CHLORIDE 0.9 % IV BOLUS (SEPSIS)
1000.0000 mL | Freq: Once | INTRAVENOUS | Status: AC
  Administered 2014-08-04: 1000 mL via INTRAVENOUS

## 2014-08-04 MED ORDER — ONDANSETRON HCL 4 MG/2ML IJ SOLN
4.0000 mg | Freq: Once | INTRAMUSCULAR | Status: AC
  Administered 2014-08-04: 4 mg via INTRAVENOUS
  Filled 2014-08-04: qty 2

## 2014-08-04 MED ORDER — DEXTROSE 5 % IV SOLN
1.0000 g | Freq: Once | INTRAVENOUS | Status: AC
  Administered 2014-08-04: 1 g via INTRAVENOUS
  Filled 2014-08-04: qty 10

## 2014-08-04 NOTE — Discharge Instructions (Signed)
Take keflex for a week.   Stay hydrated.   You have elevated d-dimer. See primary care doctor to get further workup for other diseases.   Follow up with your doctor,.   Return to ER if you have chest pain, shortness of breath, abdominal pain.

## 2014-08-04 NOTE — ED Notes (Signed)
Pt back from Ultrasound. Hooked back to 12lead, Pulse Ox and BP.

## 2014-08-04 NOTE — ED Provider Notes (Addendum)
CSN: [id number]     Arrival date & time 08/04/14  [id number] History   First MD Initiated Contact with Patient 08/04/14 [phone]     Chief Complaint  Patient presents with  . Chest Pain  . Shortness of Breath     (Consider location/radiation/quality/duration/timing/severity/associated sxs/prior Treatment) The history is provided by the patient.  Crystal Sheppard is a 23 y.o. female hx of PCOS here with chest pain, shortness of pain. Symptoms since last night after eating. Pain was epigastric then radiate to the upper abdomen. Pain is worse with inspiration, associated with chest pain and shortness of breath. Denies fever or nausea or vomiting or cough. Took ibuprofen with no relief. No hx of PE or DVT but on nuva ring.    Past Medical History  Diagnosis Date  . Polycystic ovarian disease   . Hirsutism   . Other specified complication, antepartum(646.83)     headaches  . Dysmetabolic syndrome X   . Polycystic ovaries    History reviewed. No pertinent past surgical history. Family History  Problem Relation Age of Onset  . Hypertension Mother   . Hypertension Brother   . Diabetes Paternal Grandmother   . Mental retardation Cousin     downs   History  Substance Use Topics  . Smoking status: Former Games developermoker  . Smokeless tobacco: Not on file  . Alcohol Use: Yes     Comment: occasionally   OB History   Grav Para Term Preterm Abortions TAB SAB Ect Mult Living   2 1 1  1 1    1      Review of Systems  Respiratory: Positive for shortness of breath.   Cardiovascular: Positive for chest pain.  Gastrointestinal: Positive for abdominal pain.  All other systems reviewed and are negative.     Allergies  Review of patient's allergies indicates no known allergies.  Home Medications   Prior to Admission medications   Not on File   BP 128/81  Pulse 90  Temp(Src) 98.2 F (36.8 C) (Oral)  Resp 13  Ht 5\' 6"  (1.676 m)  Wt 230 lb (104.327 kg)  BMI 37.14 kg/m2  SpO2 95%  LMP  07/31/2014 Physical Exam  Nursing note and vitals reviewed. Constitutional: She is oriented to person, place, and time. She appears well-developed.  Uncomfortable   HENT:  Head: Normocephalic.  Mouth/Throat: Oropharynx is clear and moist.  Eyes: Conjunctivae are normal. Pupils are equal, round, and reactive to light.  Neck: Normal range of motion. Neck supple.  Cardiovascular: Regular rhythm and normal heart sounds.   Borderline tachy   Pulmonary/Chest: Effort normal and breath sounds normal. No respiratory distress. She has no wheezes. She has no rales.  Abdominal: Soft. Bowel sounds are normal.  Epigastric and RUQ tenderness, ? Murphy's   Musculoskeletal: Normal range of motion. She exhibits no edema and no tenderness.  Neurological: She is alert and oriented to person, place, and time. No cranial nerve deficit. Coordination normal.  Skin: Skin is warm and dry.  Psychiatric: She has a normal mood and affect. Her behavior is normal. Judgment and thought content normal.    ED Course  Procedures (including critical care time) Labs Review Labs Reviewed  COMPREHENSIVE METABOLIC PANEL - Abnormal; Notable for the following:    Glucose, Bld 101 (*)    All other components within normal limits  URINALYSIS, ROUTINE W REFLEX MICROSCOPIC - Abnormal; Notable for the following:    APPearance TURBID (*)    Hgb urine dipstick LARGE (*)  Nitrite POSITIVE (*)    Leukocytes, UA LARGE (*)    All other components within normal limits  D-DIMER, QUANTITATIVE - Abnormal; Notable for the following:    D-Dimer, Quant 11.33 (*)    All other components within normal limits  URINE MICROSCOPIC-ADD ON - Abnormal; Notable for the following:    Squamous Epithelial / LPF MANY (*)    Bacteria, UA MANY (*)    All other components within normal limits  CBC WITH DIFFERENTIAL  PREGNANCY, URINE  LIPASE, BLOOD  I-STAT TROPOININ, ED  I-STAT TROPOININ, ED    Imaging Review Dg Chest 2 View  08/04/2014    CLINICAL DATA:  Chest pain, shortness of breath  EXAM: CHEST  2 VIEW  COMPARISON:  03/29/2014  FINDINGS: The heart size and mediastinal contours are within normal limits. Both lungs are clear. The visualized skeletal structures are unremarkable.  IMPRESSION: No active cardiopulmonary disease.   Electronically Signed   By: Elige KoHetal  Patel   On: 08/04/2014 10:27   Ct Angio Chest Pe W/cm &/or Wo Cm  08/04/2014   CLINICAL DATA:  23 year old female with shortness of breath and central chest pain.  EXAM: CT ANGIOGRAPHY CHEST WITH CONTRAST  TECHNIQUE: Multidetector CT imaging of the chest was performed using the standard protocol during bolus administration of intravenous contrast. Multiplanar CT image reconstructions and MIPs were obtained to evaluate the vascular anatomy.  CONTRAST:  80mL OMNIPAQUE IOHEXOL 350 MG/ML SOLN  COMPARISON:  Chest radiograph 08/04/2014  FINDINGS: The contrast bolus is not optimal for visualization of the pulmonary arteries but no evidence for a large or central pulmonary embolism. Small amount of soft tissue in the anterior mediastinum is most likely related to thymic tissue. No evidence for chest lymphadenopathy. No significant pericardial or pleural fluid. No gross abnormality to the thoracic aorta. Images of the upper abdomen are unremarkable.  The trachea and mainstem bronchi are patent. There is dependent atelectasis in both lower lobes. No significant airspace disease or lung consolidation. No acute bone abnormality.  Review of the MIP images confirms the above findings.  IMPRESSION: Negative chest CTA.  No evidence for a large or central pulmonary embolism as described.   Electronically Signed   By: Richarda OverlieAdam  Henn M.D.   On: 08/04/2014 14:29   [place] Abdomen Limited  08/04/2014   CLINICAL DATA:  Right upper quadrant pain, possible cholecystitis  EXAM: US ABDOMEN LIMITED - RIGHT UPPER QUADRANT  COMPARISON:  None.  FINDINGS: Gallbladder:  No gallstones or wall thickening visualized. No  sonographic Murphy sign noted.  Common bile duct:  Diameter: 4.6 mm in diameter within normal limits.  Liver:  No focal lesion identified. Within normal limits in parenchymal echogenicity.  IMPRESSION: Unremarkable right upper quadrant ultrasound.   Electronically Signed   By: Natasha MeadLiviu  Pop M.D.   On: 08/04/2014 11:38     EKG Interpretation None       Date: 08/04/2014  Rate: 97  Rhythm: normal sinus rhythm  QRS Axis: normal  Intervals: normal  ST/T Wave abnormalities: nonspecific ST changes  Conduction Disutrbances:none  Narrative Interpretation:   Old EKG Reviewed: none available    MDM   Final diagnoses:  None    Crystal Sheppard is a 23 y.o. female here with RUQ, SOB. Likely cholelithiasis vs cholecystitis. Given pleuritic pain and on birth control, will get d-dimer.   2:42 PM D-dimer positive. CT showed no PE. I called Dr. Lowella DandyHenn, radiologist, who states that there is no central PE and he doubts small  PE. Doesn't recommend VQ scan or repeat CT. Not tachy anymore after IVF. Pain free. Never hypoxic or hypotensive. Abdomen soft and nontender. Legs not swollen and no calf tenderness. Delta trop neg. RUQ US showed no gallstones. UA + UTI. Given rocephin, will d/c with keflex. I discussed elevated d-dimer with the patient and differential includes small clot, rheumatologic disorder, PCOS. Patient has no signs of CHF. She will find PMD to get further workup. I told her to return if she has severe chest pain, shortness of breath.     Richardean Canal, MD 08/04/14 1541  Richardean Canal, MD 08/04/14 1541  Richardean Canal, MD 08/04/14 [phone]

## 2014-08-04 NOTE — ED Notes (Signed)
Pt placed in Gown. Pt hooked up to 12Lead, Pulse Ox and BP.

## 2014-08-04 NOTE — ED Notes (Signed)
Pt back from X-Ray. Hooked back to 12Lead, BP, Pulse Ox.

## 2014-08-04 NOTE — ED Notes (Signed)
Pt reports central cp and sob since yesterday, also pain in upper back. Denies n/v/d. Skin warm and dry. Is a x 4. In NAD. No cardiac hx

## 2014-08-06 NOTE — ED Provider Notes (Signed)
Called and left message with patient today to follow up with her. Told her to return to ED if she has worsening chest pain, shortness of breath or if she has any leg swelling.   Richardean Canalavid H Jasdeep Dejarnett, MD 08/06/14 1630

## 2014-08-22 ENCOUNTER — Encounter (HOSPITAL_COMMUNITY): Payer: Self-pay | Admitting: Emergency Medicine

## 2015-03-14 ENCOUNTER — Emergency Department (HOSPITAL_COMMUNITY)
Admission: EM | Admit: 2015-03-14 | Discharge: 2015-03-14 | Discharge status: Absent without leave | Payer: Self-pay | Source: Home / Self Care

## 2015-03-14 ENCOUNTER — Encounter (HOSPITAL_COMMUNITY): Payer: Self-pay | Admitting: Emergency Medicine

## 2015-03-14 ENCOUNTER — Emergency Department (HOSPITAL_COMMUNITY)
Admission: EM | Admit: 2015-03-14 | Discharge: 2015-03-14 | Disposition: A | Discharge status: Home / Self Care | Payer: 59 | Source: Home / Self Care | Attending: Emergency Medicine | Admitting: Emergency Medicine

## 2015-03-14 DIAGNOSIS — K2960 Other gastritis without bleeding: Secondary | ICD-10-CM | POA: Diagnosis not present

## 2015-03-14 DIAGNOSIS — T3991XA Poisoning by unspecified nonopioid analgesic, antipyretic and antirheumatic, accidental (unintentional), initial encounter: Secondary | ICD-10-CM | POA: Diagnosis not present

## 2015-03-14 DIAGNOSIS — G44219 Episodic tension-type headache, not intractable: Secondary | ICD-10-CM | POA: Diagnosis not present

## 2015-03-14 DIAGNOSIS — T39395A Adverse effect of other nonsteroidal anti-inflammatory drugs [NSAID], initial encounter: Principal | ICD-10-CM

## 2015-03-14 LAB — POCT PREGNANCY, URINE: Preg Test, Ur: NEGATIVE

## 2015-03-14 LAB — POCT URINALYSIS DIP (DEVICE)
Bilirubin Urine: NEGATIVE
GLUCOSE, UA: NEGATIVE mg/dL
Ketones, ur: NEGATIVE mg/dL
Leukocytes, UA: NEGATIVE
NITRITE: NEGATIVE
PROTEIN: NEGATIVE mg/dL
UROBILINOGEN UA: 0.2 mg/dL (ref 0.0–1.0)
pH: 6 (ref 5.0–8.0)

## 2015-03-14 MED ORDER — SUCRALFATE 1 G PO TABS: 1.0000 g | ORAL_TABLET | Freq: Three times a day (TID) | ORAL | Status: DC

## 2015-03-14 MED ORDER — ONDANSETRON HCL 4 MG PO TABS: 4.0000 mg | ORAL_TABLET | Freq: Four times a day (QID) | ORAL | Status: DC

## 2015-03-14 NOTE — ED Provider Notes (Signed)
CSN: [id number]     Arrival date & time 03/14/15  1630 History   First MD Initiated Contact with Patient 03/14/15 1727     Chief Complaint  Patient presents with  . Abdominal Pain   (Consider location/radiation/quality/duration/timing/severity/associated sxs/prior Treatment) HPI Comments: 24 year old obese, pleasant female complaining of epigastric pain and headache. This is accompanied with nausea but no vomiting. It has been occurring for about 3 weeks. The headache is nearly daily and the nausea and abdominal discomfort is near daily. She states despite the nausea she has a normal appetite. No vomiting. No diarrhea or constipation.  The head pain is located primarily to the frontal and bitemporal areas; as well has the occiput where the posterior muscles attach to the scalp. The pain in the occiput and sometimes worse with extreme stretching of the involved muscles.  Denies problems with vision, speech, hearing, swallowing, focal paresthesias or weakness, chest pain, heaviness, tightness, pressure, shortness of breath, cough, PND, upper respiratory congestion. Denies muscle weakness.   Past Medical History  Diagnosis Date  . Polycystic ovarian disease   . Hirsutism   . Other specified complication, antepartum(646.83)     headaches  . Dysmetabolic syndrome X   . Polycystic ovaries    History reviewed. No pertinent past surgical history. Family History  Problem Relation Age of Onset  . Hypertension Mother   . Hypertension Brother   . Diabetes Paternal Grandmother   . Mental retardation Cousin     downs   History  Substance Use Topics  . Smoking status: Former Games developermoker  . Smokeless tobacco: Not on file  . Alcohol Use: Yes     Comment: occasionally   OB History    Gravida Para Term Preterm AB TAB SAB Ectopic Multiple Living   2 1 1  1 1    1      Review of Systems  Constitutional: Negative for fever, chills and appetite change.       See history of present illness  HENT:  Negative for congestion, ear pain, postnasal drip and rhinorrhea.   Genitourinary: Negative.   Neurological: Positive for headaches. Negative for tremors, syncope, facial asymmetry, speech difficulty, weakness and numbness.  All other systems reviewed and are negative.   Allergies  Review of patient's allergies indicates no known allergies.  Home Medications   Prior to Admission medications   Medication Sig Start Date End Date Taking? Authorizing Provider  cephALEXin (KEFLEX) 500 MG capsule Take 1 capsule (500 mg total) by mouth 4 (four) times daily. 08/04/14   Richardean Canalavid H Yao, MD  sucralfate (CARAFATE) 1 G tablet Take 1 tablet (1 g total) by mouth 4 (four) times daily -  with meals and at bedtime. 03/14/15   Hayden Rasmussenavid Deshante Cassell, NP   BP 163/89 mmHg  Pulse 90  Temp(Src) 98.1 F (36.7 C) (Oral)  Resp 18  SpO2 100%  LMP  Physical Exam  Constitutional: She is oriented to person, place, and time. She appears well-developed and well-nourished. No distress.  HENT:  Head: Normocephalic and atraumatic.  Mouth/Throat: No oropharyngeal exudate.  Bilateral TMs are normal. Oropharynx is clear and moist. No exudates, swelling. Soft palate rises symmetrical. Uvula and tongue are midline. No intraoral edema.  Eyes: Conjunctivae and EOM are normal. Pupils are equal, round, and reactive to light. Right eye exhibits no discharge. Left eye exhibits no discharge.  Neck: Normal range of motion. Neck supple.  Tenderness to the occiput where posterior cervical muscles insert. Mild tenderness to the left splenius capitis  muscle.  Cardiovascular: Normal rate, regular rhythm, normal heart sounds and intact distal pulses.   No murmur heard. Pulmonary/Chest: Effort normal and breath sounds normal. No respiratory distress. She has no wheezes. She has no rales.  Abdominal: Soft. Bowel sounds are normal. She exhibits no distension and no mass. There is no rebound and no guarding.  Tenderness in the epigastrium  only. Percussion is tympanic in the epigastrium. Other areas dull.  Musculoskeletal: She exhibits no edema or tenderness.  Lymphadenopathy:    She has no cervical adenopathy.  Neurological: She is alert and oriented to person, place, and time. She has normal strength. She displays no tremor. No cranial nerve deficit or sensory deficit. She exhibits normal muscle tone. Coordination normal.  Skin: Skin is warm and dry. No rash noted. No erythema.  Psychiatric: She has a normal mood and affect. Her behavior is normal. Thought content normal.  Nursing note and vitals reviewed.   ED Course  Procedures (including critical care time) Labs Review Labs Reviewed  POCT URINALYSIS DIP (DEVICE) - Abnormal; Notable for the following:    Hgb urine dipstick SMALL (*)    All other components within normal limits  POCT PREGNANCY, URINE    Imaging Review No results found.   MDM   1. NSAID induced gastritis   2. Episodic tension-type headache, not intractable     No more NSAIDS, Prilosec 20 mg a day and Zantac or Pepcid Carafate as directed. Zofran for nausea  Headache likely tension headache. Patient states she uses a computer and her phone several hours a day. With the tenderness in the posterior scalp and lesser in the temples suspect this is probably muscular tension type headache. There are no neurologic symptoms or signs. Heat to the areas of the scalp that are tender or painful Perform gentle stretches of the neck as demonstrated Change ergonomics of head position while working at computer, telephone or with writing.    Hayden Rasmussen, NP 03/14/15 1815  Hayden Rasmussen, NP 03/14/15 1816

## 2015-03-14 NOTE — ED Notes (Signed)
C/o abdominal pain. Pain is in mid abdominal region. Nausea.  Headaches.   Mild relief with ibuprofen.    Denies fever, vomiting, and diarrhea.  Normal BM.    Symptoms present x 3 wks.

## 2015-03-14 NOTE — Discharge Instructions (Signed)
Gastritis, Adult  No more NSAIDS, Prilosec 20 mg a day and Zantac or Pepcid Carafate as directe Tension Headache A tension headache is a feeling of pain, pressure, or aching often felt over the front and sides of the head. The pain can be dull or can feel tight (constricting). It is the most common type of headache. Tension headaches are not normally associated with nausea or vomiting and do not get worse with physical activity. Tension headaches can last 30 minutes to several days.  CAUSES  The exact cause is not known, but it may be caused by chemicals and hormones in the brain that lead to pain. Tension headaches often begin after stress, anxiety, or depression. Other triggers may include:  Alcohol.  Caffeine (too much or withdrawal).  Respiratory infections (colds, flu, sinus infections).  Dental problems or teeth clenching.  Fatigue.  Holding your head and neck in one position too long while using a computer. SYMPTOMS   Pressure around the head.   Dull, aching head pain.   Pain felt over the front and sides of the head.   Tenderness in the muscles of the head, neck, and shoulders. DIAGNOSIS  A tension headache is often diagnosed based on:   Symptoms.   Physical examination.   A CT scan or MRI of your head. These tests may be ordered if symptoms are severe or unusual. TREATMENT  Medicines may be given to help relieve symptoms.  HOME CARE INSTRUCTIONS   Only take over-the-counter or prescription medicines for pain or discomfort as directed by your caregiver.   Lie down in a dark, quiet room when you have a headache.   Keep a journal to find out what may be triggering your headaches. For example, write down:  What you eat and drink.  How much sleep you get.  Any change to your diet or medicines.  Try massage or other relaxation techniques.   Ice packs or heat applied to the head and neck can be used. Use these 3 to 4 times per day for 15 to 20 minutes  each time, or as needed.   Limit stress.   Sit up straight, and do not tense your muscles.   Quit smoking if you smoke.  Limit alcohol use.  Decrease the amount of caffeine you drink, or stop drinking caffeine.  Eat and exercise regularly.  Get 7 to 9 hours of sleep, or as recommended by your caregiver.  Avoid excessive use of pain medicine as recurrent headaches can occur.  SEEK MEDICAL CARE IF:   You have problems with the medicines you were prescribed.  Your medicines do not work.  You have a change from the usual headache.  You have nausea or vomiting. SEEK IMMEDIATE MEDICAL CARE IF:   Your headache becomes severe.  You have a fever.  You have a stiff neck.  You have loss of vision.  You have muscular weakness or loss of muscle control.  You lose your balance or have trouble walking.  You feel faint or pass out.  You have severe symptoms that are different from your first symptoms. MAKE SURE YOU:   Understand these instructions.  Will watch your condition.  Will get help right away if you are not doing well or get worse. Document Released: 10/07/2005 Document Revised: 12/30/2011 Document Reviewed: 09/27/2011 [facility] Patient Information 2015 [place], [place]. This information is not intended to replace advice given to you by your health care provider. Make sure you discuss any questions you have  with your health care provider.   Gastritis is soreness and swelling (inflammation) of the lining of the stomach. Gastritis can develop as a sudden onset (acute) or long-term (chronic) condition. If gastritis is not treated, it can lead to stomach bleeding and ulcers. CAUSES  Gastritis occurs when the stomach lining is weak or damaged. Digestive juices from the stomach then inflame the weakened stomach lining. The stomach lining may be weak or damaged due to viral or bacterial infections. One common bacterial infection is the Helicobacter pylori infection.  Gastritis can also result from excessive alcohol consumption, taking certain medicines, or having too much acid in the stomach.  SYMPTOMS  In some cases, there are no symptoms. When symptoms are present, they may include:  Pain or a burning sensation in the upper abdomen.  Nausea.  Vomiting.  An uncomfortable feeling of fullness after eating. DIAGNOSIS  Your caregiver may suspect you have gastritis based on your symptoms and a physical exam. To determine the cause of your gastritis, your caregiver may perform the following:  Blood or stool tests to check for the H pylori bacterium.  Gastroscopy. A thin, flexible tube (endoscope) is passed down the esophagus and into the stomach. The endoscope has a light and camera on the end. Your caregiver uses the endoscope to view the inside of the stomach.  Taking a tissue sample (biopsy) from the stomach to examine under a microscope. TREATMENT  Depending on the cause of your gastritis, medicines may be prescribed. If you have a bacterial infection, such as an H pylori infection, antibiotics may be given. If your gastritis is caused by too much acid in the stomach, H2 blockers or antacids may be given. Your caregiver may recommend that you stop taking aspirin, ibuprofen, or other nonsteroidal anti-inflammatory drugs (NSAIDs). HOME CARE INSTRUCTIONS  Only take over-the-counter or prescription medicines as directed by your caregiver.  If you were given antibiotic medicines, take them as directed. Finish them even if you start to feel better.  Drink enough fluids to keep your urine clear or pale yellow.  Avoid foods and drinks that make your symptoms worse, such as:  Caffeine or alcoholic drinks.  Chocolate.  Peppermint or mint flavorings.  Garlic and onions.  Spicy foods.  Citrus fruits, such as oranges, lemons, or limes.  Tomato-based foods such as sauce, chili, salsa, and pizza.  Fried and fatty foods.  Eat small, frequent meals  instead of large meals. SEEK IMMEDIATE MEDICAL CARE IF:   You have black or dark red stools.  You vomit blood or material that looks like coffee grounds.  You are unable to keep fluids down.  Your abdominal pain gets worse.  You have a fever.  You do not feel better after 1 week.  You have any other questions or concerns. MAKE SURE YOU:  Understand these instructions.  Will watch your condition.  Will get help right away if you are not doing well or get worse. Document Released: 10/01/2001 Document Revised: 04/07/2012 Document Reviewed: 11/20/2011 [facility] Patient Information 2015 [place], [place]. This information is not intended to replace advice given to you by your health care provider. Make sure you discuss any questions you have with your health care provider.

## 2015-03-23 ENCOUNTER — Encounter (HOSPITAL_COMMUNITY): Payer: Self-pay

## 2015-03-23 ENCOUNTER — Emergency Department (HOSPITAL_COMMUNITY)
Admission: EM | Admit: 2015-03-23 | Discharge: 2015-03-23 | Disposition: A | Discharge status: Home / Self Care | Payer: 59 | Attending: Emergency Medicine | Admitting: Emergency Medicine

## 2015-03-23 DIAGNOSIS — Z8639 Personal history of other endocrine, nutritional and metabolic disease: Secondary | ICD-10-CM | POA: Insufficient documentation

## 2015-03-23 DIAGNOSIS — Z8679 Personal history of other diseases of the circulatory system: Secondary | ICD-10-CM | POA: Insufficient documentation

## 2015-03-23 DIAGNOSIS — Z79899 Other long term (current) drug therapy: Secondary | ICD-10-CM | POA: Diagnosis not present

## 2015-03-23 DIAGNOSIS — R51 Headache: Secondary | ICD-10-CM | POA: Diagnosis not present

## 2015-03-23 DIAGNOSIS — Z792 Long term (current) use of antibiotics: Secondary | ICD-10-CM | POA: Insufficient documentation

## 2015-03-23 DIAGNOSIS — Z872 Personal history of diseases of the skin and subcutaneous tissue: Secondary | ICD-10-CM | POA: Insufficient documentation

## 2015-03-23 DIAGNOSIS — Z87891 Personal history of nicotine dependence: Secondary | ICD-10-CM | POA: Diagnosis not present

## 2015-03-23 DIAGNOSIS — R110 Nausea: Secondary | ICD-10-CM | POA: Insufficient documentation

## 2015-03-23 DIAGNOSIS — R519 Headache, unspecified: Secondary | ICD-10-CM

## 2015-03-23 HISTORY — DX: Migraine, unspecified, not intractable, without status migrainosus: G43.909

## 2015-03-23 MED ORDER — DIPHENHYDRAMINE HCL 50 MG/ML IJ SOLN
25.0000 mg | Freq: Once | INTRAMUSCULAR | Status: AC
  Administered 2015-03-23: 25 mg via INTRAVENOUS
  Filled 2015-03-23: qty 1

## 2015-03-23 MED ORDER — ONDANSETRON HCL 4 MG/2ML IJ SOLN
4.0000 mg | Freq: Once | INTRAMUSCULAR | Status: AC
  Administered 2015-03-23: 4 mg via INTRAVENOUS
  Filled 2015-03-23: qty 2

## 2015-03-23 MED ORDER — SODIUM CHLORIDE 0.9 % IV BOLUS (SEPSIS)
1000.0000 mL | Freq: Once | INTRAVENOUS | Status: AC
  Administered 2015-03-23: 1000 mL via INTRAVENOUS

## 2015-03-23 MED ORDER — KETOROLAC TROMETHAMINE 30 MG/ML IJ SOLN
30.0000 mg | Freq: Once | INTRAMUSCULAR | Status: AC
  Administered 2015-03-23: 30 mg via INTRAVENOUS
  Filled 2015-03-23: qty 1

## 2015-03-23 NOTE — ED Notes (Signed)
Pt has hx of migraines. Started seeing "headache doctor" in [place] yesterday. Was put on 3 different meds but pt does not know what they are. States she took some of them today without relief.

## 2015-03-23 NOTE — Discharge Instructions (Signed)

## 2015-03-23 NOTE — ED Notes (Signed)
Pt was here at work and started getting a headache all over esp in the temporal area bilaterally, nasueated. Feels like a migraine to her. Sometimes will get an aura but hasn't today.

## 2015-03-23 NOTE — ED Provider Notes (Signed)
CSN: [id number]     Arrival date & time 03/23/15  1207 History  This chart was scribed for non-physician practitioner working Fayrene Helper, PA-C with Cathren Laine, MD by Lyndel Safe, ED Scribe. This patient was seen in room TR11C/TR11C and the patient's care was started at 1:32 PM.  Chief Complaint  Patient presents with  . Migraine   HPI HPI Comments: Crystal Sheppard is a 24 y.o. female who presents to the Emergency Department complaining of a constant, moderate migraines that started in the front of her head and radiated around to the back of head lasting for 1 week, worsening today.Marland Kitchen Headache intensified this morning and has been persistent. She describes headache as throbbing, wraps around the head. Headache is of moderate intensity. Headache feels similar to prior headaches that she has on a regular basis. She endorse nausea only. Endorse photophobia and phonophobia with tinnitus. She denies any associated auras. No fever, chills, neck stiffness, chest pain, shortness of breath, vomiting, diarrhea, numbness, weakness, rash. Pt states she [facility]  [facility] and was placed on 3 different medications. She did taken this morning but found no relief.  Her LNMP 5/11 Pt is prescribed Nuvaring as birth control   Past Medical History  Diagnosis Date  . Polycystic ovarian disease   . Hirsutism   . Other specified complication, antepartum(646.83)     headaches  . Dysmetabolic syndrome X   . Polycystic ovaries   . Migraines    History reviewed. No pertinent past surgical history. Family History  Problem Relation Age of Onset  . Hypertension Mother   . Hypertension Brother   . Diabetes Paternal Grandmother   . Mental retardation Cousin     downs   History  Substance Use Topics  . Smoking status: Former Games developer  . Smokeless tobacco: Not on file  . Alcohol Use: Yes     Comment: occasionally   OB History    Gravida Para Term Preterm AB TAB  SAB Ectopic Multiple Living   Review of Systems  All other systems reviewed and are negative.     Allergies  Review of patient's allergies indicates no known allergies.  Home Medications   Prior to Admission medications   Medication Sig Start Date End Date Taking? Authorizing Provider  cephALEXin (KEFLEX) 500 MG capsule Take 1 capsule (500 mg total) by mouth 4 (four) times daily. 08/04/14   Richardean Canal, MD  ondansetron (ZOFRAN) 4 MG tablet Take 1 tablet (4 mg total) by mouth every 6 (six) hours. Prn nausea 03/14/15   Hayden Rasmussen, NP  sucralfate (CARAFATE) 1 G tablet Take 1 tablet (1 g total) by mouth 4 (four) times daily -  with meals and at bedtime. 03/14/15   Hayden Rasmussen, NP   BP 137/89 mmHg  Pulse 92  Temp(Src) 98.1 F (36.7 C) (Oral)  Resp 16  Ht  (1.676 m)  Wt 239 lb (108.41 kg)  BMI 38.59 kg/m2  SpO2 98%  LMP 03/01/2015  Physical Exam  Constitutional: She appears well-developed and well-nourished. No distress.  HENT:  Head: Normocephalic and atraumatic.  Eyes: Conjunctivae are normal. Right eye exhibits no discharge. Left eye exhibits no discharge. No scleral icterus.  Neck: Neck supple.  Cardiovascular: Normal rate, regular rhythm and normal heart sounds.   Pulmonary/Chest: Effort normal and breath sounds normal. No respiratory distress.  Neurological: She  is alert. She has normal strength. No cranial nerve deficit or sensory deficit. Coordination and gait normal. GCS eye subscore is 4. GCS verbal subscore is 5. GCS motor subscore is 6.  Skin: Skin is warm and dry. No rash noted. She is not diaphoretic. No erythema.  Psychiatric: She has a normal mood and affect.  Nursing note and vitals reviewed.   ED Course  Procedures  DIAGNOSTIC STUDIES: Oxygen Saturation is 98% on room air, normal by my interpretation.    COORDINATION OF CARE: 1:35 PM Discussed treatment plan which includes to order a migraine cocktail with pt. Pt acknowledges and  agrees to plan.   Headache similar to previous, no fever, neck stiffness, neuro findings or new symptoms to suggest more serious etiology.  I don't think SAH, ICH, meningitis, encephalitis, mass at this time.  No recent trauma.  I don't feel imaging necessary at this time.  Plan to control symptoms.  2:34 PM Significant improvement of headache after receiving migraine cocktail. Her pain has fully resolved. She is stable for discharge. She can continue taking her headache medication and follow-up with her headache specialist as needed. Return precaution discussed.   Labs Review Labs Reviewed - No data to display  Imaging Review No results found.   EKG Interpretation None      MDM   Final diagnoses:  Bad headache    BP 137/89 mmHg  Pulse 92  Temp(Src) 98.1 F (36.7 C) (Oral)  Resp 16  Ht 5\' 6"  (1.676 m)  Wt 239 lb (108.41 kg)  BMI 38.59 kg/m2  SpO2 98%  LMP 03/01/2015  I personally performed the services described in this documentation, which was scribed in my presence. The recorded information has been reviewed and is accurate.     Fayrene HelperBowie Tyshawna Alarid, PA-C 03/23/15 1434  Cathren LaineKevin Steinl, MD 03/24/15 [id number]

## 2015-03-23 NOTE — ED Notes (Signed)
Pt states her husband is on his way from [place] to get her.

## 2015-07-27 ENCOUNTER — Encounter (HOSPITAL_COMMUNITY): Payer: Self-pay

## 2015-10-11 IMAGING — US US ABDOMEN LIMITED
1 series · 14 of 25 positions shown · non-contrast
Comparison: None.

CLINICAL DATA: Right upper quadrant pain, possible cholecystitis

EXAM:
US ABDOMEN LIMITED - RIGHT UPPER QUADRANT

[Series 1: us abdomen limited · 0.27mm/px · 14 of 47 slices shown]
[im 1/47]
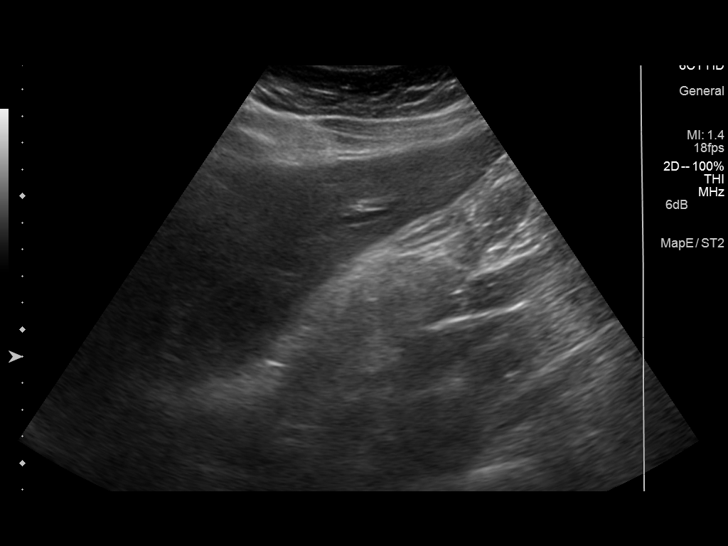
[im 4/47]
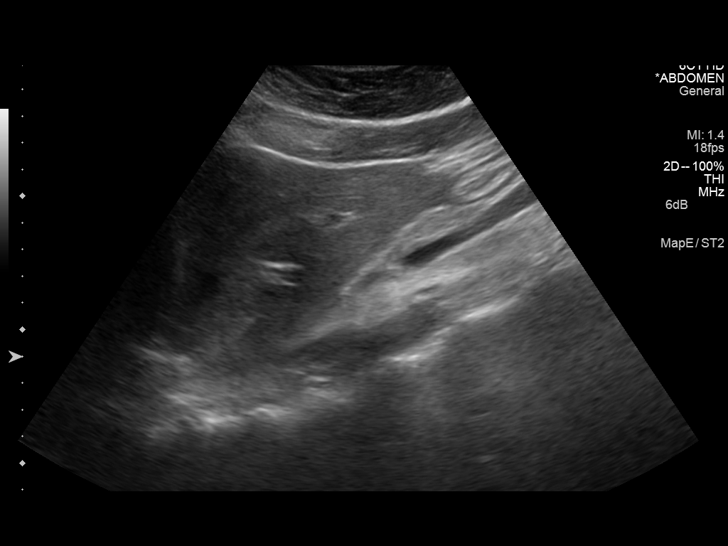
[im 8/47]
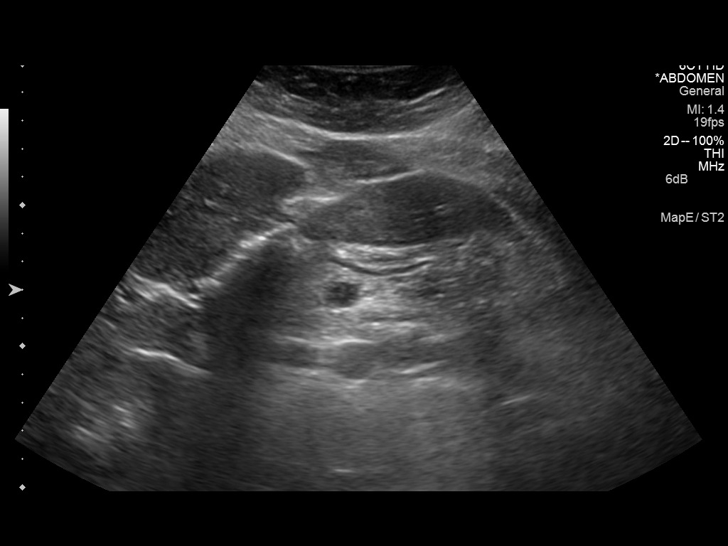
[im 12/47]
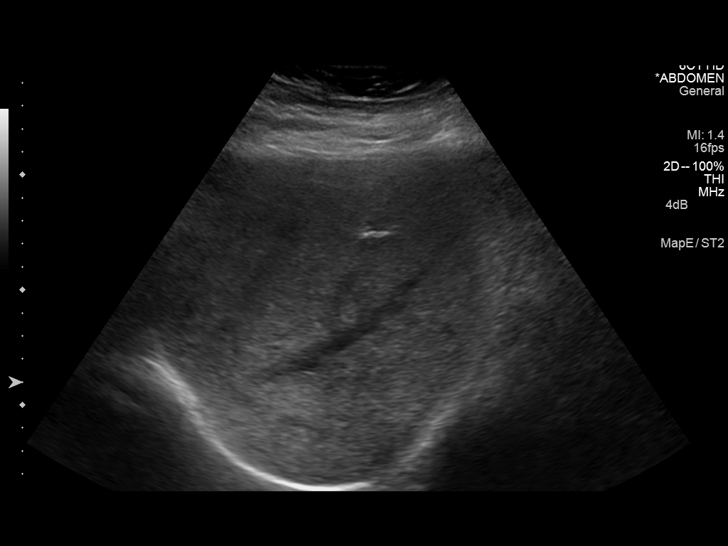
[im 16/47]
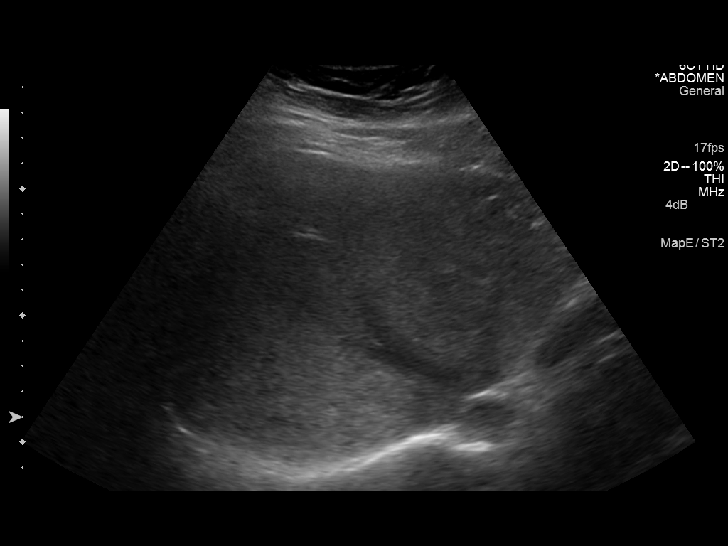
[im 18/47]
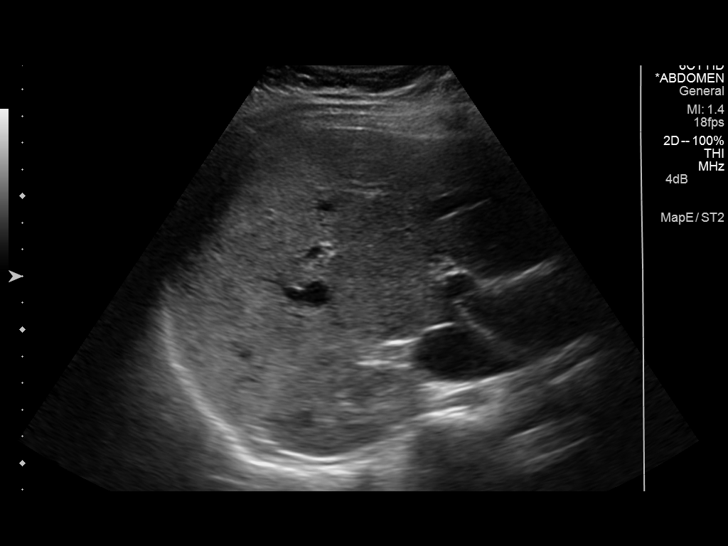
[im 22/47]
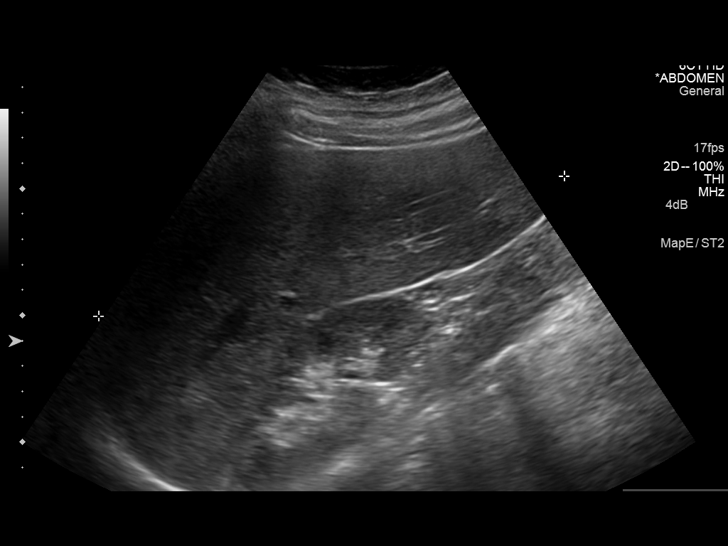
[im 25/47]
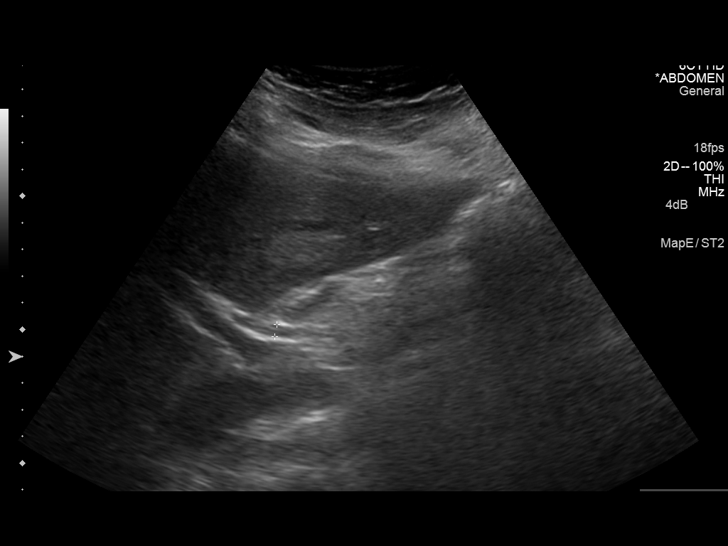
[im 29/47]
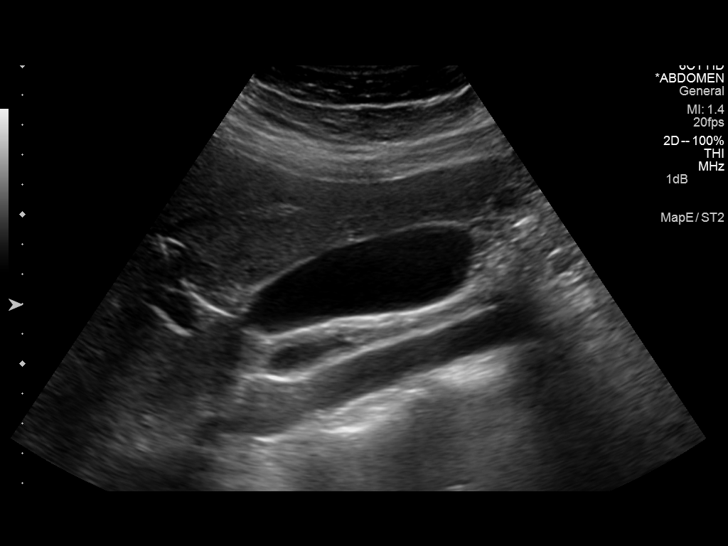
[im 31/47]
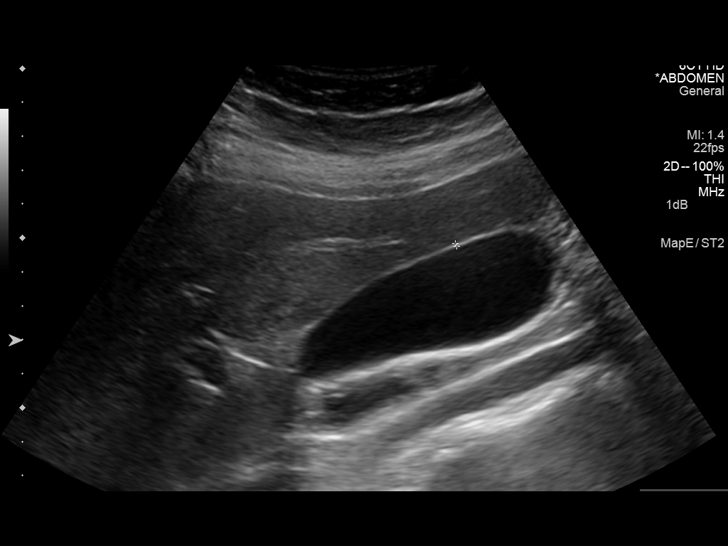
[im 35/47]
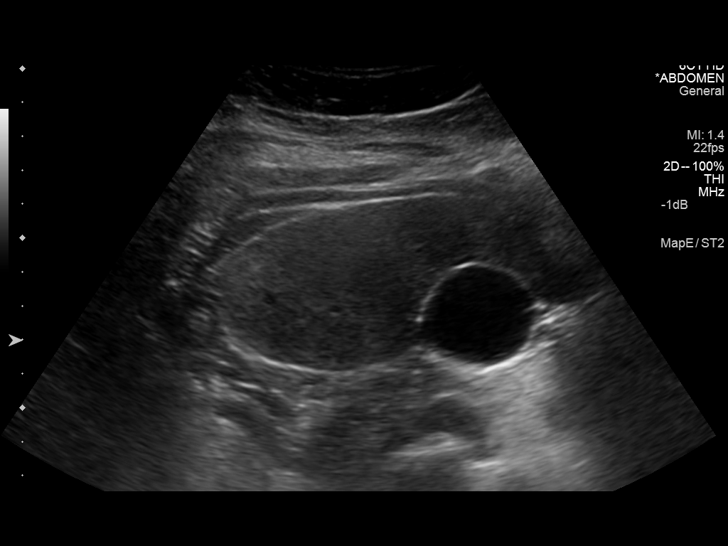
[im 39/47]
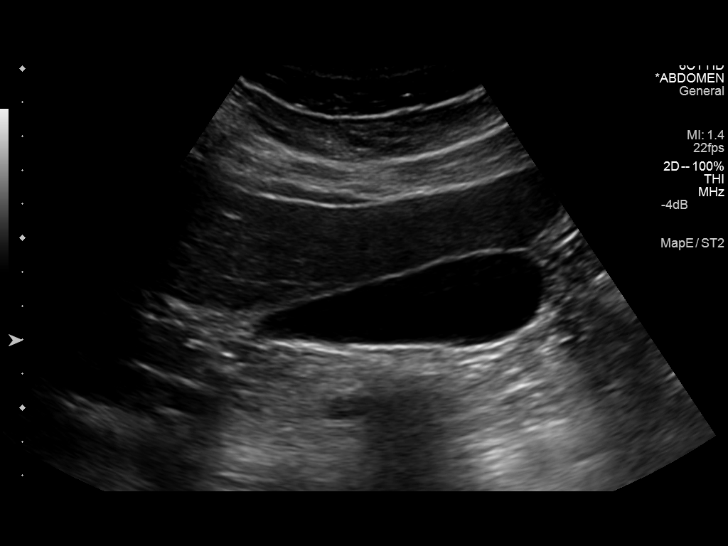
[im 43/47]
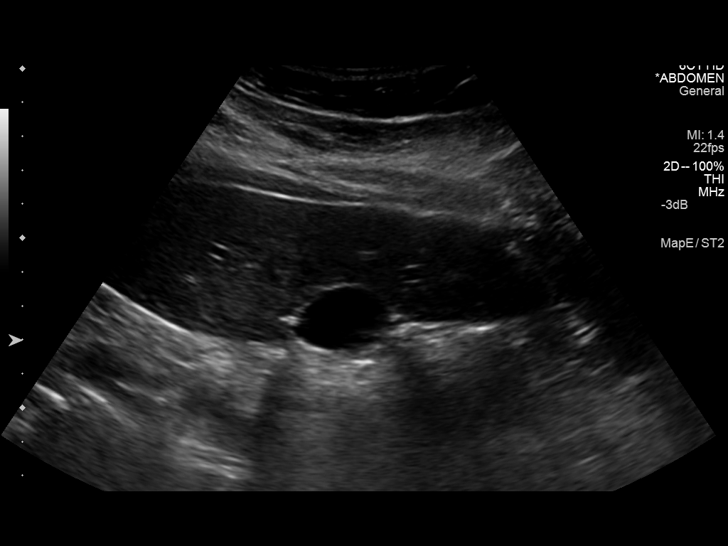
[im 47/47]
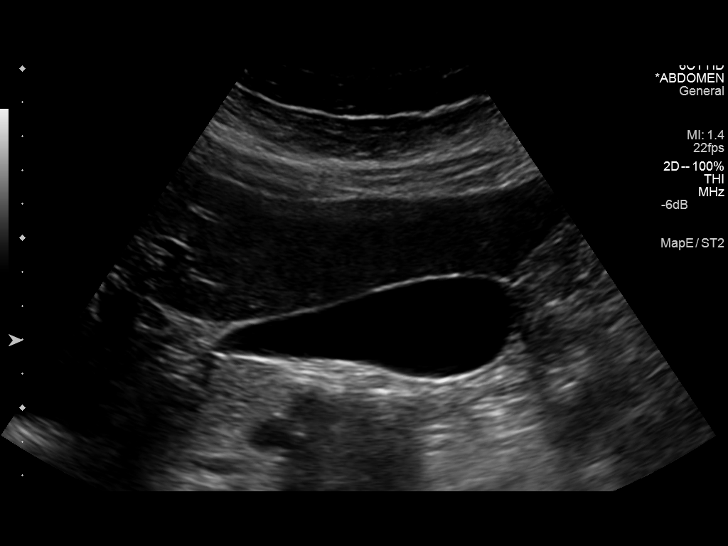

[14 of 25 positions shown; findings below may reference images not displayed]

FINDINGS: Gallbladder:

No gallstones or wall thickening visualized. No sonographic Murphy
sign noted.

Common bile duct:

Diameter: 4.6 mm in diameter within normal limits.

Liver:

No focal lesion identified. Within normal limits in parenchymal
echogenicity.
IMPRESSION: Unremarkable right upper quadrant ultrasound.

## 2015-10-11 IMAGING — CT CT ANGIO CHEST
2 of 10 series · 19 of 46 positions shown · IV contrast (omnipaque)
Comparison: Chest radiograph 08/04/2014

CLINICAL DATA: 23-year-old female with shortness of breath and
central chest pain.

EXAM:
CT ANGIOGRAPHY CHEST WITH CONTRAST
TECHNIQUE: Multidetector CT imaging of the chest was performed using the
standard protocol during bolus administration of intravenous
contrast. Multiplanar CT image reconstructions and MIPs were
obtained to evaluate the vascular anatomy.
CONTRAST:  80mL OMNIPAQUE IOHEXOL 350 MG/ML SOLN

[Series 6: thins · axial · 0.66mm/px · z∈[+1021,+1236]mm · 16 of 243 slices shown]
[im 14/243  lung]
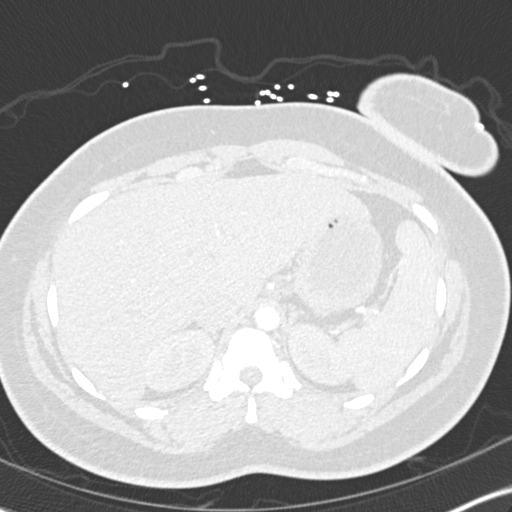
[im 27/243  soft-tissue]
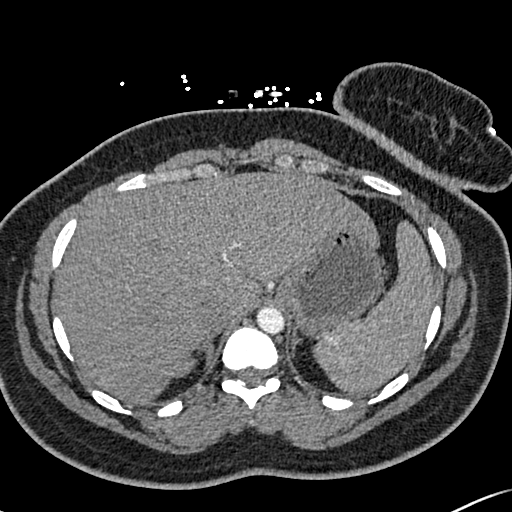
[im 41/243  lung]
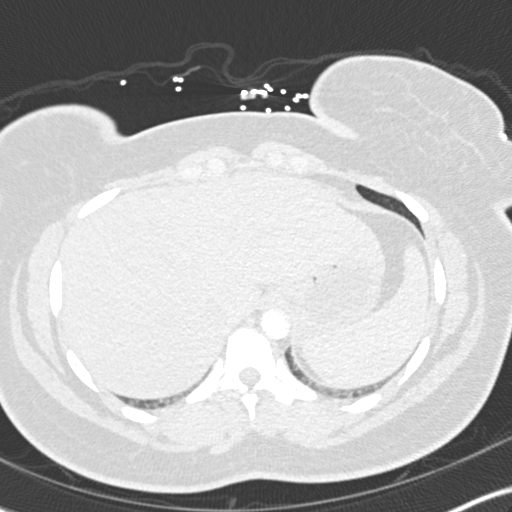
[im 54/243  soft-tissue]
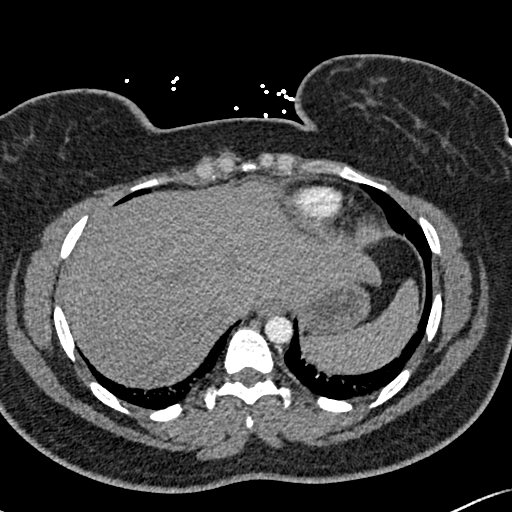
[im 68/243  lung]
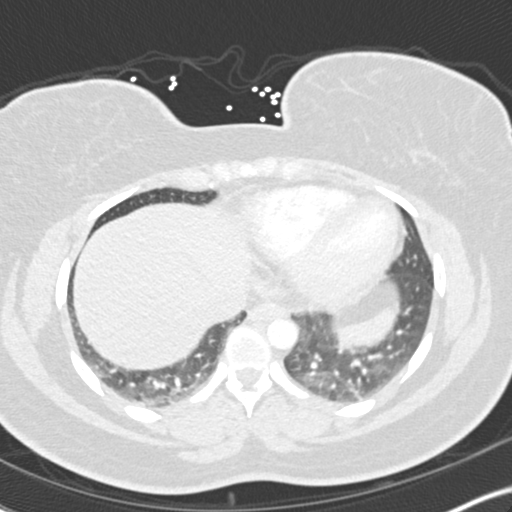
[im 81/243  soft-tissue]
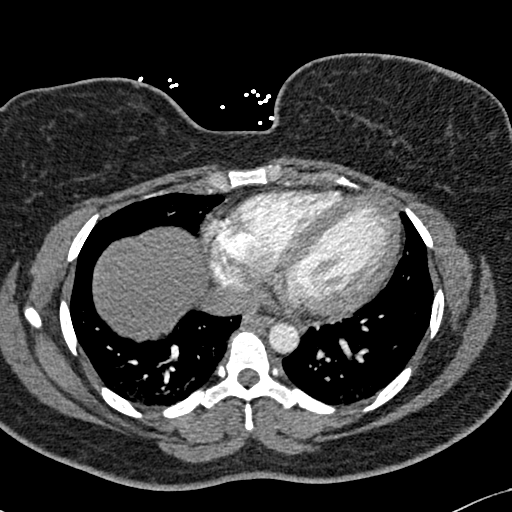
[im 95/243  lung]
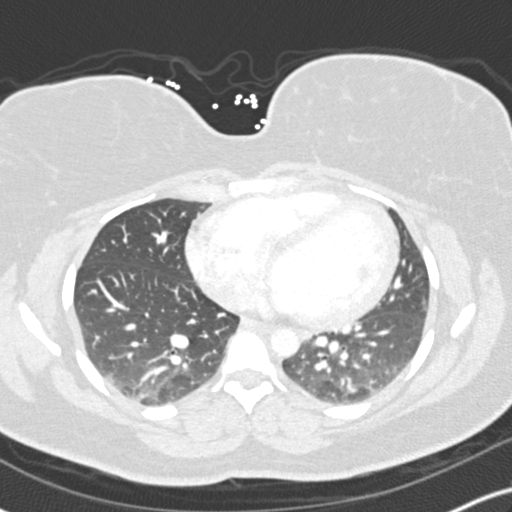
[im 108/243  soft-tissue]
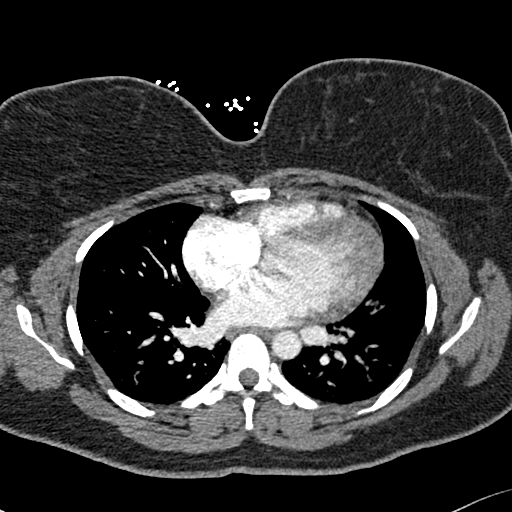
[im 135/243  lung]
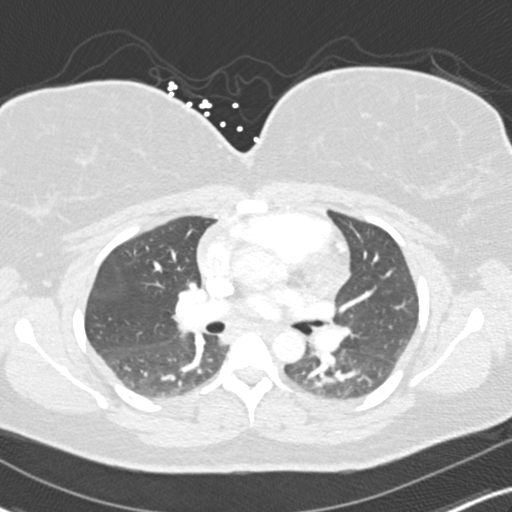
[im 148/243  soft-tissue]
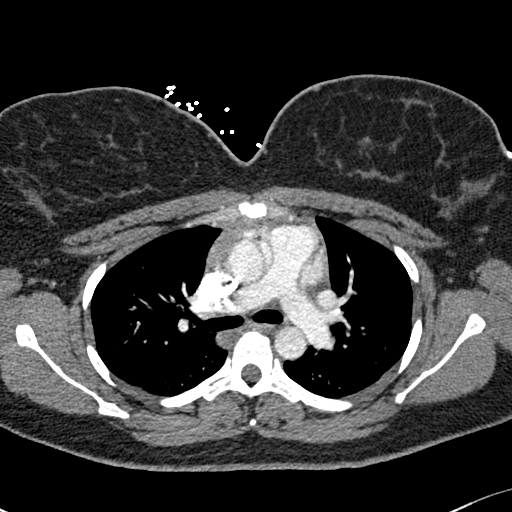
[im 162/243  lung]
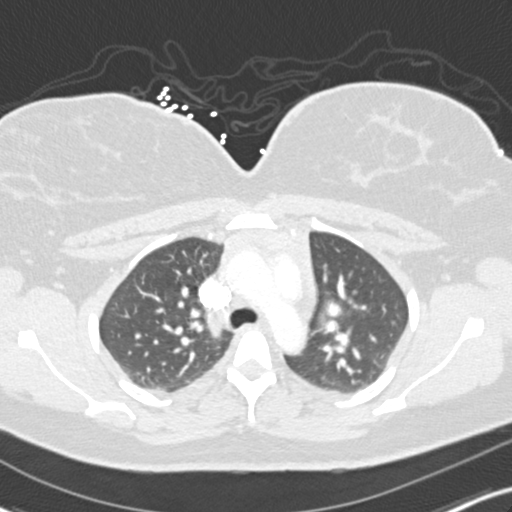
[im 175/243  soft-tissue]
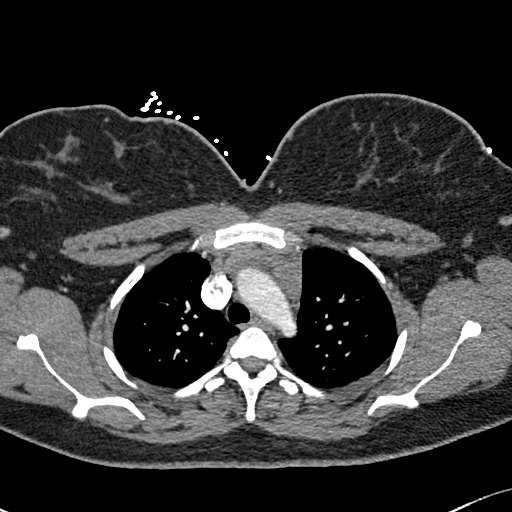
[im 189/243  lung]
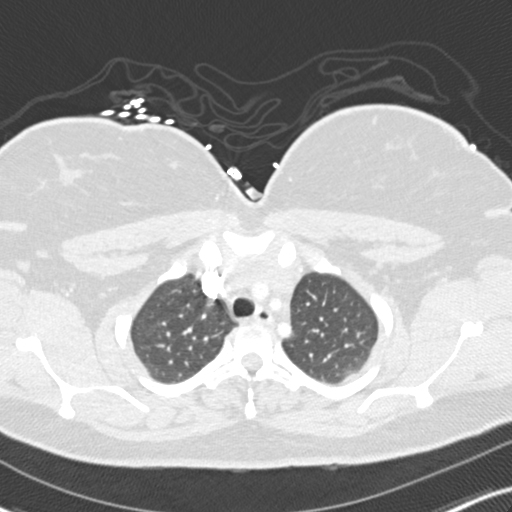
[im 202/243  soft-tissue]
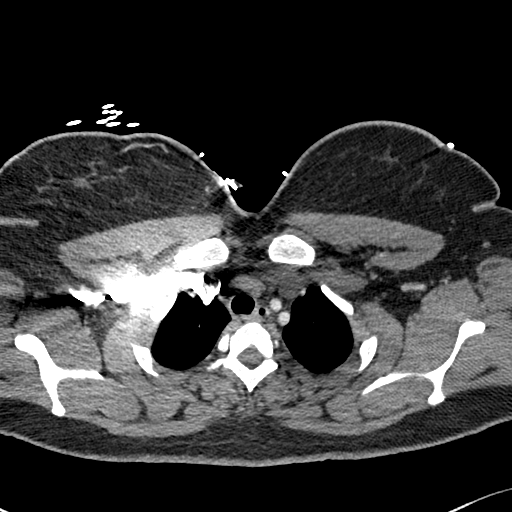
[im 216/243  lung]
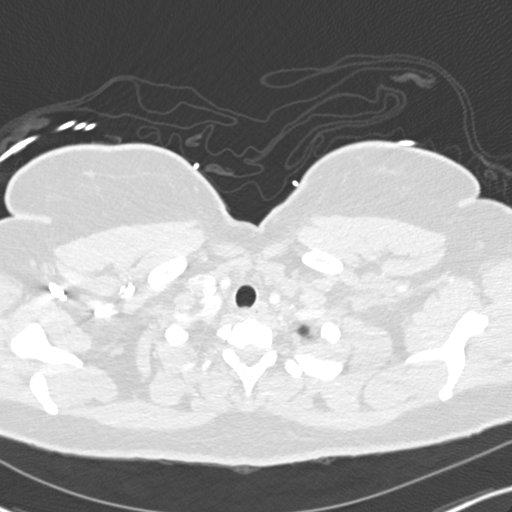
[im 229/243  soft-tissue]
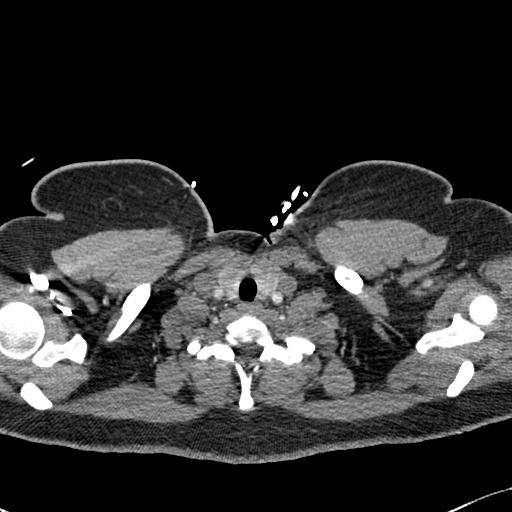

[Series 8: coronal mpr · coronal · 0.59mm/px · 3 of 93 slices shown]
[im 24/93  soft-tissue]
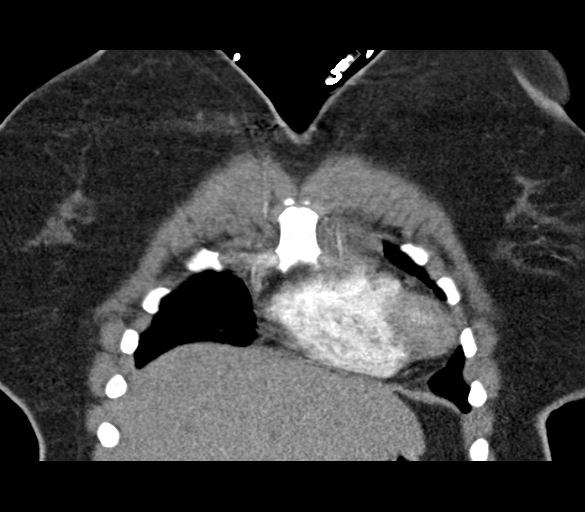
[im 47/93  soft-tissue]
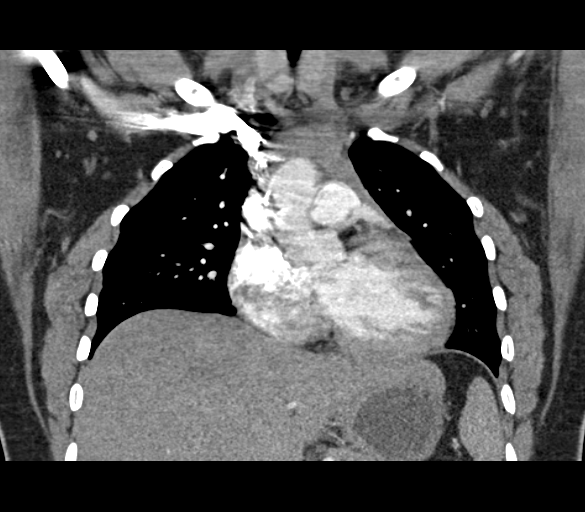
[im 70/93  soft-tissue]
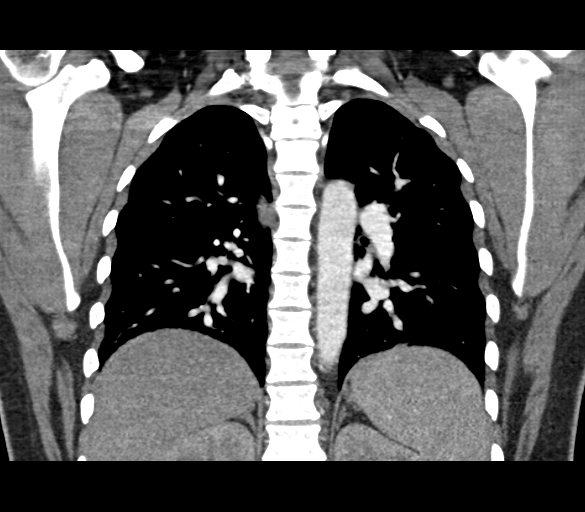

[19 of 46 positions shown; findings below may reference images not displayed]

FINDINGS: The contrast bolus is not optimal for visualization of the pulmonary
arteries but no evidence for a large or central pulmonary embolism.
Small amount of soft tissue in the anterior mediastinum is most
likely related to thymic tissue. No evidence for chest
lymphadenopathy. No significant pericardial or pleural fluid. No
gross abnormality to the thoracic aorta. Images of the upper abdomen
are unremarkable.

The trachea and mainstem bronchi are patent. There is dependent
atelectasis in both lower lobes. No significant airspace disease or
lung consolidation. No acute bone abnormality.

Review of the MIP images confirms the above findings.
IMPRESSION: Negative chest CTA.

No evidence for a large or central pulmonary embolism as described.

## 2015-10-22 NOTE — L&D Delivery Note (Signed)
Delivery Note At 6:46 PM a healthy female was delivered via Vaginal, Spontaneous Delivery (Presentation: Occiput ant  ).  APGAR: 9, 9; weight pending .   Placenta status: Complete.  Cord:  with the following complications: None.  Cord 3 Vs, pH: N/A  Anesthesia:  epidural Episiotomy: None Lacerations: 2nd degree, vulvar bilateral Suture Repair: 2.0 3.0 vicryl Est. Blood Loss (mL):  300  Mom to postpartum.  Baby to Couplet care / Skin to Skin.  Crystal Sheppard 10/07/2016, 7:38 PM

## 2015-10-24 MED FILL — PROMETHAZINE 12.5 MG TABLET: 12.5 | 10 days supply | Qty: 60 | Fill #2

## 2015-10-26 DIAGNOSIS — R875 Abnormal microbiological findings in specimens from female genital organs: Secondary | ICD-10-CM | POA: Diagnosis not present

## 2015-10-26 DIAGNOSIS — Z01419 Encounter for gynecological examination (general) (routine) without abnormal findings: Secondary | ICD-10-CM | POA: Diagnosis not present

## 2015-10-28 DIAGNOSIS — M25571 Pain in right ankle and joints of right foot: Secondary | ICD-10-CM | POA: Diagnosis not present

## 2015-10-28 DIAGNOSIS — W000XXA Fall on same level due to ice and snow, initial encounter: Secondary | ICD-10-CM | POA: Diagnosis not present

## 2015-10-28 DIAGNOSIS — Y9329 Activity, other involving ice and snow: Secondary | ICD-10-CM | POA: Diagnosis not present

## 2015-10-28 DIAGNOSIS — S93401A Sprain of unspecified ligament of right ankle, initial encounter: Secondary | ICD-10-CM | POA: Diagnosis not present

## 2015-10-28 DIAGNOSIS — Z79899 Other long term (current) drug therapy: Secondary | ICD-10-CM | POA: Diagnosis not present

## 2015-11-08 MED FILL — BACLOFEN 10 MG TABLET: 10 | 20 days supply | Qty: 60 | Fill #0

## 2015-11-28 MED FILL — PROMETHAZINE 12.5 MG TABLET: 12.5 | 10 days supply | Qty: 60 | Fill #0

## 2015-11-28 MED FILL — TOPIRAMATE 100 MG TABLET: 100 | 30 days supply | Qty: 45 | Fill #1

## 2015-12-20 DIAGNOSIS — G43709 Chronic migraine without aura, not intractable, without status migrainosus: Secondary | ICD-10-CM | POA: Diagnosis not present

## 2015-12-25 MED FILL — PROMETHAZINE 12.5 MG TABLET: 12.5 | 10 days supply | Qty: 60 | Fill #1

## 2015-12-25 MED FILL — TOPIRAMATE 100 MG TABLET: 100 | 30 days supply | Qty: 45 | Fill #2

## 2015-12-28 DIAGNOSIS — R1011 Right upper quadrant pain: Secondary | ICD-10-CM | POA: Diagnosis not present

## 2015-12-28 DIAGNOSIS — R1013 Epigastric pain: Secondary | ICD-10-CM | POA: Diagnosis not present

## 2015-12-29 DIAGNOSIS — R1013 Epigastric pain: Secondary | ICD-10-CM | POA: Diagnosis not present

## 2015-12-29 MED FILL — traMADol HCL 50 MG TABS: 50 | 5 days supply | Qty: 20 | Fill #0

## 2016-01-09 MED FILL — TOPIRAMATE 100 MG TABLET: 100 | 30 days supply | Qty: 90 | Fill #0

## 2016-01-09 MED FILL — BACLOFEN 10 MG TABLET: 10 | 20 days supply | Qty: 60 | Fill #1

## 2016-01-12 DIAGNOSIS — R1013 Epigastric pain: Secondary | ICD-10-CM | POA: Diagnosis not present

## 2016-01-18 MED FILL — MINTOX/LIDOCAINE 2% 7:1: 200-200-20 | 2 days supply | Qty: 480 | Fill #0

## 2016-01-25 MED FILL — PROMETHAZINE 12.5 MG TABLET: 12.5 | 10 days supply | Qty: 60 | Fill #2

## 2016-02-08 MED FILL — BACLOFEN 10 MG TABLET: 10 | 20 days supply | Qty: 60 | Fill #2

## 2016-02-09 MED FILL — TOPIRAMATE 100 MG TABLET: 100 | 30 days supply | Qty: 90 | Fill #1

## 2016-02-23 DIAGNOSIS — N912 Amenorrhea, unspecified: Secondary | ICD-10-CM | POA: Diagnosis not present

## 2016-02-23 DIAGNOSIS — Z3200 Encounter for pregnancy test, result unknown: Secondary | ICD-10-CM | POA: Diagnosis not present

## 2016-02-29 DIAGNOSIS — Z36 Encounter for antenatal screening of mother: Secondary | ICD-10-CM | POA: Diagnosis not present

## 2016-03-13 DIAGNOSIS — Z3201 Encounter for pregnancy test, result positive: Secondary | ICD-10-CM | POA: Diagnosis not present

## 2016-03-27 DIAGNOSIS — Z3491 Encounter for supervision of normal pregnancy, unspecified, first trimester: Secondary | ICD-10-CM | POA: Diagnosis not present

## 2016-03-27 DIAGNOSIS — Z118 Encounter for screening for other infectious and parasitic diseases: Secondary | ICD-10-CM | POA: Diagnosis not present

## 2016-03-27 DIAGNOSIS — Z3480 Encounter for supervision of other normal pregnancy, unspecified trimester: Secondary | ICD-10-CM | POA: Diagnosis not present

## 2016-03-27 DIAGNOSIS — Z36 Encounter for antenatal screening of mother: Secondary | ICD-10-CM | POA: Diagnosis not present

## 2016-03-27 DIAGNOSIS — Z113 Encounter for screening for infections with a predominantly sexual mode of transmission: Secondary | ICD-10-CM | POA: Diagnosis not present

## 2016-04-09 DIAGNOSIS — Z36 Encounter for antenatal screening of mother: Secondary | ICD-10-CM | POA: Diagnosis not present

## 2016-04-11 DIAGNOSIS — G43709 Chronic migraine without aura, not intractable, without status migrainosus: Secondary | ICD-10-CM | POA: Diagnosis not present

## 2016-04-11 DIAGNOSIS — Z3A14 14 weeks gestation of pregnancy: Secondary | ICD-10-CM | POA: Diagnosis not present

## 2016-05-01 DIAGNOSIS — Z36 Encounter for antenatal screening of mother: Secondary | ICD-10-CM | POA: Diagnosis not present

## 2016-05-20 ENCOUNTER — Emergency Department (HOSPITAL_COMMUNITY): Payer: 59

## 2016-05-20 ENCOUNTER — Encounter (HOSPITAL_COMMUNITY): Payer: Self-pay | Admitting: Emergency Medicine

## 2016-05-20 ENCOUNTER — Emergency Department (HOSPITAL_COMMUNITY)
Admission: EM | Admit: 2016-05-20 | Discharge: 2016-05-20 | Disposition: A | Discharge status: Home / Self Care | Payer: 59 | Attending: Emergency Medicine | Admitting: Emergency Medicine

## 2016-05-20 DIAGNOSIS — F410 Panic disorder [episodic paroxysmal anxiety] without agoraphobia: Secondary | ICD-10-CM | POA: Diagnosis not present

## 2016-05-20 DIAGNOSIS — Z3A18 18 weeks gestation of pregnancy: Secondary | ICD-10-CM | POA: Insufficient documentation

## 2016-05-20 DIAGNOSIS — R0602 Shortness of breath: Secondary | ICD-10-CM | POA: Diagnosis not present

## 2016-05-20 DIAGNOSIS — Z87891 Personal history of nicotine dependence: Secondary | ICD-10-CM | POA: Diagnosis not present

## 2016-05-20 DIAGNOSIS — O99342 Other mental disorders complicating pregnancy, second trimester: Secondary | ICD-10-CM | POA: Insufficient documentation

## 2016-05-20 LAB — CBC WITH DIFFERENTIAL/PLATELET
BASOS PCT: 0 %
Basophils Absolute: 0 10*3/uL (ref 0.0–0.1)
Eosinophils Absolute: 0.1 10*3/uL (ref 0.0–0.7)
Eosinophils Relative: 1 %
HEMATOCRIT: 33.7 % — AB (ref 36.0–46.0)
Hemoglobin: 11 g/dL — ABNORMAL LOW (ref 12.0–15.0)
Lymphocytes Relative: 32 %
Lymphs Abs: 2.1 10*3/uL (ref 0.7–4.0)
MCH: 27.4 pg (ref 26.0–34.0)
MCHC: 32.6 g/dL (ref 30.0–36.0)
MCV: 84 fL (ref 78.0–100.0)
MONO ABS: 0.7 10*3/uL (ref 0.1–1.0)
Monocytes Relative: 10 %
NEUTROS ABS: 3.9 10*3/uL (ref 1.7–7.7)
Neutrophils Relative %: 57 %
Platelets: 189 10*3/uL (ref 150–400)
RBC: 4.01 MIL/uL (ref 3.87–5.11)
RDW: 12.4 % (ref 11.5–15.5)
WBC: 6.8 10*3/uL (ref 4.0–10.5)

## 2016-05-20 LAB — BASIC METABOLIC PANEL
Anion gap: 6 (ref 5–15)
BUN: 9 mg/dL (ref 6–20)
CHLORIDE: 109 mmol/L (ref 101–111)
CO2: 20 mmol/L — ABNORMAL LOW (ref 22–32)
CREATININE: 0.51 mg/dL (ref 0.44–1.00)
Calcium: 9.5 mg/dL (ref 8.9–10.3)
GFR calc Af Amer: >60 mL/min (ref >60)
GFR calc non Af Amer: >60 mL/min (ref >60)
GLUCOSE: 92 mg/dL (ref 65–99)
POTASSIUM: 3.7 mmol/L (ref 3.5–5.1)
Sodium: 135 mmol/L (ref 135–145)

## 2016-05-20 LAB — I-STAT TROPONIN, ED: Troponin i, poc: 0 ng/mL (ref 0.00–0.08)

## 2016-05-20 NOTE — ED Triage Notes (Addendum)
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 Pt started having sharp chest pain. Pt told co-workers and began Barrister's clerk. She started hyperventilating and staff brought her to ED. Pt is 5 months pregnant.

## 2016-05-20 NOTE — ED Provider Notes (Signed)
[facility]-EMERGENCY DEPT Provider Note   CSN: [id number] Arrival date & time: 05/20/16  1427  First Provider Contact:  None       History   Chief Complaint Chief Complaint  Patient presents with  . Chest Pain    HPI Crystal Sheppard is a 25 y.o. female.  Patient is a 25 year old female with no pertinent past medical history presents the ED with complaint of chest pain and shortness of breath, onset prior to arrival. Patient reports while she was at work she began to have sudden onset of chest tightness with associated shortness of breath, hyperventilating, lightheadedness and nausea. Patient's coworkers report they found her in the locker room and she was hyperventilating for approximately 25 minutes before she began to slow down. Patient reports having similar episodes of panic attacks in the past. She notes she is currently [redacted] weeks pregnant. Patient denies any known stressors. She notes since arrival to the ED her symptoms haven't significantly improved however she continues to report having mild chest tightness across her chest. Denies fever, chills, headache, visual changes, cough, palpitations, abdominal pain, vomiting, urinary symptoms, numbness, tingling, weakness. Patient denies taking any medications. She notes she is followed by OB/GYN and states her next appointment is on 8/9. Patient denies any known complications during her pregnancy thus far.    Chest Pain   Associated symptoms include nausea and shortness of breath.    Past Medical History:  Diagnosis Date  . Dysmetabolic syndrome X   . Hirsutism   . Migraines   . Other specified complication, antepartum(646.83)    headaches  . Polycystic ovarian disease   . Polycystic ovaries     Patient Active Problem List   Diagnosis Date Noted  . NSVD (normal spontaneous vaginal delivery) 09/26/2012  . Hx of maternal laceration, 3rd degree, currently pregnant 09/26/2012  . Anemia of mother in pregnancy, delivered 09/26/2012      History reviewed. No pertinent surgical history.  OB History    Gravida Para Term Preterm AB Living   3 1 1  0 1 1   SAB TAB Ectopic Multiple Live Births   0 1 0 0 1       Home Medications    Prior to Admission medications   Medication Sig Start Date End Date Taking? Authorizing Provider  cephALEXin (KEFLEX) 500 MG capsule Take 1 capsule (500 mg total) by mouth 4 (four) times daily. 08/04/14   Charlynne Pander, MD  ondansetron (ZOFRAN) 4 MG tablet Take 1 tablet (4 mg total) by mouth every 6 (six) hours. Prn nausea 03/14/15   Hayden Rasmussen, NP  sucralfate (CARAFATE) 1 G tablet Take 1 tablet (1 g total) by mouth 4 (four) times daily -  with meals and at bedtime. 03/14/15   Hayden Rasmussen, NP    Family History Family History  Problem Relation Age of Onset  . Asthma Mother   . Hypertension Mother   . Hypertension Brother   . Asthma Brother   . Diabetes Paternal Grandmother   . Mental retardation Cousin     downs    Social History Social History  Substance Use Topics  . Smoking status: Former Games developer  . Smokeless tobacco: Former Neurosurgeon  . Alcohol use Yes     Comment: occasionally     Allergies   Review of patient's allergies indicates no known allergies.   Review of Systems Review of Systems  Respiratory: Positive for chest tightness and shortness of breath.   Gastrointestinal: Positive for nausea.  Neurological: Positive for light-headedness.  All other systems reviewed and are negative.    Physical Exam Updated Vital Signs BP 131/75   Pulse 90   Resp 21   SpO2 100%   Physical Exam  Constitutional: She is oriented to person, place, and time. She appears well-developed and well-nourished. No distress.  Pt appears mildly anxious but is not hyperventilating.  HENT:  Head: Normocephalic and atraumatic.  Mouth/Throat: Oropharynx is clear and moist. No oropharyngeal exudate.  Eyes: Conjunctivae and EOM are normal. Pupils are equal, round, and reactive to light.  Right eye exhibits no discharge. Left eye exhibits no discharge. No scleral icterus.  Neck: Normal range of motion. Neck supple.  Cardiovascular: Normal rate, regular rhythm, normal heart sounds and intact distal pulses.   Pulmonary/Chest: Effort normal and breath sounds normal. No respiratory distress. She has no wheezes. She has no rales. She exhibits no tenderness.  Abdominal: Soft. Bowel sounds are normal. She exhibits no distension and no mass. There is no tenderness. There is no rebound and no guarding. No hernia.  Musculoskeletal: Normal range of motion. She exhibits no edema.  Neurological: She is alert and oriented to person, place, and time.  Skin: Skin is warm and dry. She is not diaphoretic.  Nursing note and vitals reviewed.    ED Treatments / Results  Labs (all labs ordered are listed, but only abnormal results are displayed) Labs Reviewed  CBC WITH DIFFERENTIAL/PLATELET - Abnormal; Notable for the following:       Result Value   Hemoglobin 11.0 (*)    HCT 33.7 (*)    All other components within normal limits  BASIC METABOLIC PANEL - Abnormal; Notable for the following:    CO2 20 (*)    All other components within normal limits  I-STAT TROPOININ, ED    EKG  EKG Interpretation  Date/Time:  Monday May 20 2016 14:29:00 EDT Ventricular Rate:  100 PR Interval:    QRS Duration: 98 QT Interval:  353 QTC Calculation: 456 R Axis:   32 Text Interpretation:  Sinus tachycardia No significant change since last tracing except rate faster Confirmed by KNAPP  MD-J, JON ([id number]) on 05/20/2016 2:34:34 PM       Radiology Dg Chest 2 View  Result Date: 05/20/2016 CLINICAL DATA:  Shortness of breath.  Panic attack. EXAM: CHEST  2 VIEW COMPARISON:  August 04, 2014 FINDINGS: Haziness over the bases is consistent with overlapping soft tissues. The heart, hila, mediastinum, lungs, and pleura are normal. IMPRESSION: No active cardiopulmonary disease. Electronically Signed   By: Gerome Sam III M.D   On: 05/20/2016 15:43    Procedures Procedures (including critical care time)  Medications Ordered in ED Medications - No data to display   Initial Impression / Assessment and Plan / ED Course  I have reviewed the triage vital signs and the nursing notes.  Pertinent labs & imaging results that were available during my care of the patient were reviewed by me and considered in my medical decision making (see chart for details).  Clinical Course   Patient presents with acute episode of chest pain, shortness of breath, hyperventilating, lightheadedness and nausea that started appx. 30 minutes PTA. She reports having similar episodes of anxiety attacks in the past. Patient states she is [redacted] weeks pregnant. She notes since arrival to the ED her symptoms have significantly improved. VSS. On exam patient appeared mildly anxious, remaining exam unremarkable.  Rapid nurse came and evaluated the patient in the ED.  FHT 155. Pt cleared by OB.  Troponin negative. Labs unremarkable. EKG showed sinus tachycardia, HR 100, otherwise no changes from prior. Pt's HR 88 on my initial exam. CXR negative.   On reevaluation, patient is sitting resting comfortably in bed. She denies any pain or symptoms at this time and notes her chest tightness has completely resolved. I suspect pt's sxs are likely due to panic attack. Discussed results and plan for discharge with patient. Advised patient to follow up with her OB at her scheduled appointment on 8/9. Patient remains hemodynamically stable and is appropriate for discharge. Discussed return precautions with patient.  Final Clinical Impressions(s) / ED Diagnoses   Final diagnoses:  Panic attack    New Prescriptions New Prescriptions   No medications on file     Barrett Henle, PA-C 05/20/16 1552    Linwood Dibbles, MD 05/21/16 [phone]

## 2016-05-20 NOTE — Progress Notes (Signed)
Pt taken off O2 and left on Room Air. Pt in no distress, spo2 100%. RT will continue to monitor.

## 2016-05-20 NOTE — Progress Notes (Signed)
1433 Arrived to evaluate this 25yo G3P1  @ 19.[redacted] wks GA in with report of sudden onset of sharp chest pain followed by a panic attack.  SOB and hyperventilation resolved on my arrival.  Pt previously unable to tell staff [facility] but now says 10/13/16.  [facility] RN performed Doppler of FHT's.  FHT WNL. 1505  Dr. Cherly Hensen notified of above.  She states patient can be OB cleared and should follow up as scheduled in the office.

## 2016-05-20 NOTE — Discharge Instructions (Signed)
Follow-up with your OB at your scheduled appointment. Please return to the Emergency Department if symptoms worsen or new onset of fever, chest pain, difficulty breathing, abdominal pain, vomiting, numbness, tingling, weakness, syncope.

## 2016-05-20 NOTE — Progress Notes (Addendum)
Pt brought to ED by co-workers with hyperventilation and significantly increased WOB (RR >60) and unable to speak due to a reported panic attack. Pt placed on 5L [place] and coached by RT to slow breathing down and take deep breaths in through nose and out her mouth along with deep "sigh". Pt eventually able to return to normal breathing pattern and able to speak in full sentences. Pt more stable at this time. RN and MD at bedside. RT will continue to monitor.

## 2016-05-20 NOTE — ED Notes (Signed)
Rapid OB RN at bedside.  

## 2016-05-29 DIAGNOSIS — Z36 Encounter for antenatal screening of mother: Secondary | ICD-10-CM | POA: Diagnosis not present

## 2016-06-10 DIAGNOSIS — Z36 Encounter for antenatal screening of mother: Secondary | ICD-10-CM | POA: Diagnosis not present

## 2016-06-13 ENCOUNTER — Other Ambulatory Visit (HOSPITAL_COMMUNITY): Payer: Self-pay | Admitting: Obstetrics & Gynecology

## 2016-06-13 DIAGNOSIS — O283 Abnormal ultrasonic finding on antenatal screening of mother: Secondary | ICD-10-CM

## 2016-06-13 DIAGNOSIS — Z3A23 23 weeks gestation of pregnancy: Secondary | ICD-10-CM

## 2016-06-13 DIAGNOSIS — Z3689 Encounter for other specified antenatal screening: Secondary | ICD-10-CM

## 2016-06-17 ENCOUNTER — Ambulatory Visit (HOSPITAL_COMMUNITY)
Admission: RE | Admit: 2016-06-17 | Discharge: 2016-06-17 | Disposition: A | Discharge status: Home / Self Care | Payer: 59 | Source: Ambulatory Visit | Attending: Obstetrics & Gynecology | Admitting: Obstetrics & Gynecology

## 2016-06-17 ENCOUNTER — Other Ambulatory Visit (HOSPITAL_COMMUNITY): Payer: Self-pay | Admitting: Obstetrics & Gynecology

## 2016-06-17 DIAGNOSIS — Z3689 Encounter for other specified antenatal screening: Secondary | ICD-10-CM

## 2016-06-17 DIAGNOSIS — Z0489 Encounter for examination and observation for other specified reasons: Secondary | ICD-10-CM

## 2016-06-17 DIAGNOSIS — IMO0002 Reserved for concepts with insufficient information to code with codable children: Secondary | ICD-10-CM

## 2016-06-17 DIAGNOSIS — E669 Obesity, unspecified: Secondary | ICD-10-CM | POA: Diagnosis not present

## 2016-06-17 DIAGNOSIS — O99212 Obesity complicating pregnancy, second trimester: Secondary | ICD-10-CM | POA: Insufficient documentation

## 2016-06-17 DIAGNOSIS — Z3A23 23 weeks gestation of pregnancy: Secondary | ICD-10-CM | POA: Diagnosis not present

## 2016-06-17 DIAGNOSIS — Z36 Encounter for antenatal screening of mother: Secondary | ICD-10-CM | POA: Diagnosis not present

## 2016-06-17 DIAGNOSIS — O289 Unspecified abnormal findings on antenatal screening of mother: Secondary | ICD-10-CM | POA: Diagnosis not present

## 2016-06-17 DIAGNOSIS — O283 Abnormal ultrasonic finding on antenatal screening of mother: Secondary | ICD-10-CM

## 2016-06-20 ENCOUNTER — Other Ambulatory Visit (HOSPITAL_COMMUNITY): Payer: Self-pay | Admitting: Obstetrics & Gynecology

## 2016-06-20 DIAGNOSIS — Z3689 Encounter for other specified antenatal screening: Secondary | ICD-10-CM

## 2016-06-20 DIAGNOSIS — Z3A23 23 weeks gestation of pregnancy: Secondary | ICD-10-CM

## 2016-06-28 ENCOUNTER — Encounter (HOSPITAL_COMMUNITY): Payer: Self-pay | Admitting: Emergency Medicine

## 2016-06-28 ENCOUNTER — Emergency Department (HOSPITAL_COMMUNITY)
Admission: EM | Admit: 2016-06-28 | Discharge: 2016-06-28 | Disposition: A | Discharge status: Home / Self Care | Payer: 59 | Attending: Emergency Medicine | Admitting: Emergency Medicine

## 2016-06-28 DIAGNOSIS — F410 Panic disorder [episodic paroxysmal anxiety] without agoraphobia: Secondary | ICD-10-CM | POA: Insufficient documentation

## 2016-06-28 DIAGNOSIS — Z87891 Personal history of nicotine dependence: Secondary | ICD-10-CM | POA: Diagnosis not present

## 2016-06-28 DIAGNOSIS — O99342 Other mental disorders complicating pregnancy, second trimester: Secondary | ICD-10-CM | POA: Insufficient documentation

## 2016-06-28 DIAGNOSIS — Z3A24 24 weeks gestation of pregnancy: Secondary | ICD-10-CM | POA: Insufficient documentation

## 2016-06-28 DIAGNOSIS — R002 Palpitations: Secondary | ICD-10-CM | POA: Diagnosis not present

## 2016-06-28 DIAGNOSIS — F419 Anxiety disorder, unspecified: Secondary | ICD-10-CM | POA: Diagnosis not present

## 2016-06-28 LAB — I-STAT CHEM 8, ED
BUN: 7 mg/dL (ref 6–20)
CREATININE: 0.5 mg/dL (ref 0.44–1.00)
Calcium, Ion: 1.25 mmol/L (ref 1.15–1.40)
Chloride: 105 mmol/L (ref 101–111)
Glucose, Bld: 102 mg/dL — ABNORMAL HIGH (ref 65–99)
HCT: 32 % — ABNORMAL LOW (ref 36.0–46.0)
Hemoglobin: 10.9 g/dL — ABNORMAL LOW (ref 12.0–15.0)
POTASSIUM: 3.8 mmol/L (ref 3.5–5.1)
Sodium: 138 mmol/L (ref 135–145)
TCO2: 23 mmol/L (ref 0–100)

## 2016-06-28 LAB — URINE MICROSCOPIC-ADD ON

## 2016-06-28 LAB — URINALYSIS, ROUTINE W REFLEX MICROSCOPIC
Bilirubin Urine: NEGATIVE
Glucose, UA: NEGATIVE mg/dL
Ketones, ur: NEGATIVE mg/dL
Nitrite: NEGATIVE
Protein, ur: NEGATIVE mg/dL
SPECIFIC GRAVITY, URINE: 1.025 (ref 1.005–1.030)
pH: 6.5 (ref 5.0–8.0)
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
## 2016-06-28 NOTE — ED Triage Notes (Signed)
Pt reports currently 24 weeks preg, LMP March 2017. States was returning to work after eating lunch and felt her heart was racing. Pt awake, alert, oriented x4. States symptoms have improved.

## 2016-06-28 NOTE — Progress Notes (Signed)
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 Baby moving well, denies any pain. Pt sees Dr Seymour Barslavoie, no problems with pregnancy. Called Dr Billy Coastaavon informed of pt status, may just doppler FHR.

## 2016-07-11 DIAGNOSIS — O358XX1 Maternal care for other (suspected) fetal abnormality and damage, fetus 1: Secondary | ICD-10-CM | POA: Diagnosis not present

## 2016-07-11 DIAGNOSIS — O359XX1 Maternal care for (suspected) fetal abnormality and damage, unspecified, fetus 1: Secondary | ICD-10-CM | POA: Diagnosis not present

## 2016-07-15 ENCOUNTER — Ambulatory Visit (HOSPITAL_COMMUNITY): Payer: 59

## 2016-07-19 ENCOUNTER — Ambulatory Visit (HOSPITAL_COMMUNITY): Payer: 59

## 2016-07-24 DIAGNOSIS — Z369 Encounter for antenatal screening, unspecified: Secondary | ICD-10-CM | POA: Diagnosis not present

## 2016-07-24 DIAGNOSIS — Z1389 Encounter for screening for other disorder: Secondary | ICD-10-CM | POA: Diagnosis not present

## 2016-07-24 DIAGNOSIS — Z3689 Encounter for other specified antenatal screening: Secondary | ICD-10-CM | POA: Diagnosis not present

## 2016-07-24 DIAGNOSIS — Z3483 Encounter for supervision of other normal pregnancy, third trimester: Secondary | ICD-10-CM | POA: Diagnosis not present

## 2016-07-26 ENCOUNTER — Ambulatory Visit (HOSPITAL_COMMUNITY)
Admission: RE | Admit: 2016-07-26 | Discharge: 2016-07-26 | Disposition: A | Discharge status: Home / Self Care | Payer: 59 | Source: Ambulatory Visit | Attending: Obstetrics & Gynecology | Admitting: Obstetrics & Gynecology

## 2016-07-26 ENCOUNTER — Encounter (HOSPITAL_COMMUNITY): Payer: Self-pay

## 2016-08-21 MED FILL — FeRivaFA 110-1 MG CAP: 110-1 | 30 days supply | Qty: 30 | Fill #0

## 2016-08-28 ENCOUNTER — Ambulatory Visit (HOSPITAL_COMMUNITY)
Admission: EM | Admit: 2016-08-28 | Discharge: 2016-08-28 | Disposition: A | Discharge status: Home / Self Care | Payer: 59 | Attending: Family Medicine | Admitting: Family Medicine

## 2016-08-28 ENCOUNTER — Encounter (HOSPITAL_COMMUNITY): Payer: Self-pay | Admitting: Family Medicine

## 2016-08-28 DIAGNOSIS — J019 Acute sinusitis, unspecified: Secondary | ICD-10-CM

## 2016-08-28 MED ORDER — IPRATROPIUM BROMIDE 0.03 % NA SOLN: 2.0000 | Freq: Two times a day (BID) | NASAL | 0 refills | Status: DC

## 2016-08-28 NOTE — ED Triage Notes (Signed)
Pt here for sinus pressure, pain and drainage. sts some nausea from the drainage.

## 2016-08-28 NOTE — ED Provider Notes (Addendum)
[facility]    CSN: [id number] Arrival date & time: 08/28/16  1705     History   Chief Complaint Chief Complaint  Patient presents with  . Facial Pain    HPI Crystal Sheppard is a 25 y.o. G3P1011 at 3373w3d presenting for evaluation of congestion.   She reports 1 week of constant, worsening congestion and post nasal drainage which keeps her from sleeping well. It irritates her throat and this causes cough. She endorses headache with coughing fits, not otherwise. Tried alkaseltzer without improvement. Nothing else tried. She reports feet swelling without hand swelling. No RUQ abd pain. +GFM, no vaginal bleeding or discharge.   HPI  Past Medical History:  Diagnosis Date  . Dysmetabolic syndrome X   . Hirsutism   . Migraines   . Other specified complication, antepartum(646.83)    headaches  . Polycystic ovarian disease   . Polycystic ovaries     Patient Active Problem List   Diagnosis Date Noted  . NSVD (normal spontaneous vaginal delivery) 09/26/2012  . Hx of maternal laceration, 3rd degree, currently pregnant 09/26/2012  . Anemia of mother in pregnancy, delivered 09/26/2012    History reviewed. No pertinent surgical history.  OB History    Gravida Para Term Preterm AB Living   3 1 1  0 1 1   SAB TAB Ectopic Multiple Live Births   0 1 0 0 1       Home Medications    Prior to Admission medications   Medication Sig Start Date End Date Taking? Authorizing Provider  acetaminophen (TYLENOL) 500 MG chewable tablet Chew 1,000 mg by mouth every 6 (six) hours as needed for pain.    Historical Provider, MD  ipratropium (ATROVENT) 0.03 % nasal spray Place 2 sprays into both nostrils every 12 (twelve) hours. 08/28/16   Tyrone Nineyan B Jaya Lapka, MD  Prenatal Vit-Fe Fumarate-FA (PRENATAL MULTIVITAMIN) TABS tablet Take 1 tablet by mouth daily at 12 noon.    Historical Provider, MD    Family History Family History  Problem Relation Age of Onset  . Asthma Mother   .  Hypertension Mother   . Hypertension Brother   . Asthma Brother   . Diabetes Paternal Grandmother   . Mental retardation Cousin     downs    Social History Social History  Substance Use Topics  . Smoking status: Former Games developermoker  . Smokeless tobacco: Former NeurosurgeonUser  . Alcohol use Yes     Comment: occasionally    Allergies   Patient has no known allergies.   Review of Systems Review of Systems As above  Physical Exam Triage Vital Signs ED Triage Vitals  Enc Vitals Group     BP 08/28/16 1718 139/87     Pulse Rate 08/28/16 1718 102     Resp 08/28/16 1718 18     Temp 08/28/16 1718 98.2 F (36.8 C)     Temp Source 08/28/16 1718 Oral     SpO2 08/28/16 1718 99 %     Weight --      Height --      Head Circumference --      Peak Flow --      Pain Score 08/28/16 1721 5     Pain Loc --      Pain Edu? --      Excl. in GC? --    No data found.   Updated Vital Signs BP 139/87 (BP Location: Left Arm)   Pulse 102   Temp  98.2 F (36.8 C) (Oral)   Resp 18   SpO2 99%   Physical Exam  Constitutional: She is oriented to person, place, and time. She appears well-developed and well-nourished. No distress.  HENT:  Right Ear: External ear normal.  Left Ear: External ear normal.  Mouth/Throat: No oropharyngeal exudate.  oropharyngeal erythema with cobblestoning. Injected boggy nasal turbinates.   Eyes: EOM are normal. Pupils are equal, round, and reactive to light. No scleral icterus.  Neck: Neck supple. No JVD present.  Cardiovascular: Normal rate, regular rhythm, normal heart sounds and intact distal pulses.   No murmur heard. Pulmonary/Chest: Effort normal and breath sounds normal. No respiratory distress.  Abdominal: Soft. Bowel sounds are normal. She exhibits no distension. There is no tenderness.  Musculoskeletal: Normal range of motion. She exhibits no tenderness.  No hand swelling, trace pitting LE edema  Lymphadenopathy:    She has no cervical adenopathy.    Neurological: She is alert and oriented to person, place, and time. She exhibits normal muscle tone.  Skin: Skin is warm and dry.  Vitals reviewed.  UC Treatments / Results  Labs (all labs ordered are listed, but only abnormal results are displayed) Labs Reviewed - No data to display  EKG  EKG Interpretation None       Radiology No results found.  Procedures Procedures (including critical care time)  Medications Ordered in UC Medications - No data to display   Initial Impression / Assessment and Plan / UC Course  I have reviewed the triage vital signs and the nursing notes.  Pertinent labs & imaging results that were available during my care of the patient were reviewed by me and considered in my medical decision making (see chart for details).  Final Clinical Impressions(s) / UC Diagnoses   Final diagnoses:  Acute rhinosinusitis   25 y.o. female G3P1011 at 5647w3d presenting with rhinosinusitis without purulence or fever.  - Nasal saline encouraged, continue po hydration, vaporizer - Rx atrovent nasal spray if symptoms are persistent (preg category B) - BP very near HTN here without other features of pre-eclampsia. She will monitor BP with home cuff and [facility] if > 140/90. She will also follow up with OB/GYN within the next week as scheduled.  New Prescriptions Discharge Medication List as of 08/28/2016  6:03 PM    START taking these medications   Details  ipratropium (ATROVENT) 0.03 % nasal spray Place 2 sprays into both nostrils every 12 (twelve) hours., Starting Wed 08/28/2016, Print         Tyrone Nineyan B Montasia Chisenhall, MD 08/28/16 [id number]

## 2016-08-28 NOTE — Discharge Instructions (Signed)
Try the nasal saline OTC and/or neti pot for nasal drainage. You may also try atrovent nasal spray. Try honey or cough lozenges to suppress cough. If you develop fever or worsening symptoms please return for follow up.   Your blood pressure is almost to the level of 140/90, which would be a possible sign of pre-eclampsia. It is important that you follow up with your OB within the next 24-48 hours for blood pressure recheck. If you develop worsening swelling, headache, or abdominal pain you need to proceed directly to [facility].

## 2016-09-10 DIAGNOSIS — Z3483 Encounter for supervision of other normal pregnancy, third trimester: Secondary | ICD-10-CM | POA: Diagnosis not present

## 2016-09-10 DIAGNOSIS — Z3685 Encounter for antenatal screening for Streptococcus B: Secondary | ICD-10-CM | POA: Diagnosis not present

## 2016-10-02 ENCOUNTER — Inpatient Hospital Stay (HOSPITAL_COMMUNITY)
Admission: AD | Admit: 2016-10-02 | Discharge: 2016-10-03 | Disposition: A | Discharge status: Home / Self Care | Payer: No Typology Code available for payment source | Source: Ambulatory Visit | Attending: Obstetrics & Gynecology | Admitting: Obstetrics & Gynecology

## 2016-10-02 ENCOUNTER — Encounter (HOSPITAL_COMMUNITY): Payer: Self-pay

## 2016-10-02 DIAGNOSIS — Z3A38 38 weeks gestation of pregnancy: Secondary | ICD-10-CM | POA: Diagnosis not present

## 2016-10-02 DIAGNOSIS — O26893 Other specified pregnancy related conditions, third trimester: Secondary | ICD-10-CM | POA: Diagnosis present

## 2016-10-02 DIAGNOSIS — Z3689 Encounter for other specified antenatal screening: Secondary | ICD-10-CM
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
## 2016-10-03 ENCOUNTER — Inpatient Hospital Stay (HOSPITAL_COMMUNITY): Payer: No Typology Code available for payment source

## 2016-10-03 DIAGNOSIS — Z041 Encounter for examination and observation following transport accident: Secondary | ICD-10-CM | POA: Diagnosis not present

## 2016-10-03 DIAGNOSIS — Z331 Pregnant state, incidental: Secondary | ICD-10-CM | POA: Diagnosis not present

## 2016-10-03 DIAGNOSIS — O26893 Other specified pregnancy related conditions, third trimester: Secondary | ICD-10-CM | POA: Diagnosis not present

## 2016-10-03 DIAGNOSIS — O9A213 Injury, poisoning and certain other consequences of external causes complicating pregnancy, third trimester: Secondary | ICD-10-CM | POA: Diagnosis not present

## 2016-10-03 DIAGNOSIS — Z3A38 38 weeks gestation of pregnancy: Secondary | ICD-10-CM | POA: Diagnosis not present

## 2016-10-03 NOTE — Discharge Instructions (Signed)
Braxton Hicks Contractions Contractions of the uterus can occur throughout pregnancy. Contractions are not always a sign that you are in labor.  WHAT ARE BRAXTON HICKS CONTRACTIONS?  Contractions that occur before labor are called Braxton Hicks contractions, or false labor. Toward the end of pregnancy (32-34 weeks), these contractions can develop more often and may become more forceful. This is not true labor because these contractions do not result in opening (dilatation) and thinning of the cervix. They are sometimes difficult to tell apart from true labor because these contractions can be forceful and people have different pain tolerances. You should not feel embarrassed if you go to the hospital with false labor. Sometimes, the only way to tell if you are in true labor is for your health care provider to look for changes in the cervix. If there are no prenatal problems or other health problems associated with the pregnancy, it is completely safe to be sent home with false labor and await the onset of true labor. HOW CAN YOU TELL THE DIFFERENCE BETWEEN TRUE AND FALSE LABOR? False Labor   The contractions of false labor are usually shorter and not as hard as those of true labor.   The contractions are usually irregular.   The contractions are often felt in the front of the lower abdomen and in the groin.   The contractions may go away when you walk around or change positions while lying down.   The contractions get weaker and are shorter lasting as time goes on.   The contractions do not usually become progressively stronger, regular, and closer together as with true labor.  True Labor   Contractions in true labor last 30-70 seconds, become very regular, usually become more intense, and increase in frequency.   The contractions do not go away with walking.   The discomfort is usually felt in the top of the uterus and spreads to the lower abdomen and low back.   True labor can be  determined by your health care provider with an exam. This will show that the cervix is dilating and getting thinner.  WHAT TO REMEMBER  Keep up with your usual exercises and follow other instructions given by your health care provider.   Take medicines as directed by your health care provider.   Keep your regular prenatal appointments.   Eat and drink lightly if you think you are going into labor.   If Braxton Hicks contractions are making you uncomfortable:   Change your position from lying down or resting to walking, or from walking to resting.   Sit and rest in a tub of warm water.   Drink 2-3 glasses of water. Dehydration may cause these contractions.   Do slow and deep breathing several times an hour.  WHEN SHOULD I SEEK IMMEDIATE MEDICAL CARE? Seek immediate medical care if:  Your contractions become stronger, more regular, and closer together.   You have fluid leaking or gushing from your vagina.   You have a fever.   You pass blood-tinged mucus.   You have vaginal bleeding.   You have continuous abdominal pain.   You have low back pain that you never had before.   You feel your baby's head pushing down and causing pelvic pressure.   Your baby is not moving as much as it used to.  This information is not intended to replace advice given to you by your health care provider. Make sure you discuss any questions you have with your health care   provider. Document Released: 10/07/2005 Document Revised: 01/29/2016 Document Reviewed: 07/19/2013 Elsevier Interactive Patient Education  2017 Else[employer] Inc[employer] Introduction Patient Name: ________________________________________________ Patient Due Date: ____________________ What is a fetal movement count? A fetal movement count is the number of times that you feel your baby move during a certain amount of time. This may also be called a fetal kick count. A fetal movement count is recommended for every pregnant  woman. You may be asked to start counting fetal movements as early as week 28 of your pregnancy. Pay attention to when your baby is most active. You may notice your baby's sleep and wake cycles. You may also notice things that make your baby move more. You should do a fetal movement count:  When your baby is normally most active.  At the same time each day. A good time to count movements is while you are resting, after having something to eat and drink. How do I count fetal movements? 1. Find a quiet, comfortable area. Sit, or lie down on your side. 2. Write down the date, the start time and stop time, and the number of movements that you felt between those two times. Take this information with you to your health care visits. 3. For 2 hours, count kicks, flutters, swishes, rolls, and jabs. You should feel at least 10 movements during 2 hours. 4. You may stop counting after you have felt 10 movements. 5. If you do not feel 10 movements in 2 hours, have something to eat and drink. Then, keep resting and counting for 1 hour. If you feel at least 4 movements during that hour, you may stop counting. Contact a health care provider if:  You feel fewer than 4 movements in 2 hours.  Your baby is not moving like he or she usually does. Date: ____________ Start time: ____________ Stop time: ____________ Movements: ____________ Date: ____________ Start time: ____________ Stop time: ____________ Movements: ____________ Date: ____________ Start time: ____________ Stop time: ____________ Movements: ____________ Date: ____________ Start time: ____________ Stop time: ____________ Movements: ____________ Date: ____________ Start time: ____________ Stop time: ____________ Movements: ____________ Date: ____________ Start time: ____________ Stop time: ____________ Movements: ____________ Date: ____________ Start time: ____________ Stop time: ____________ Movements: ____________ Date: ____________ Start time:  ____________ Stop time: ____________ Movements: ____________ Date: ____________ Start time: ____________ Stop time: ____________ Movements: ____________ This information is not intended to replace advice given to you by your health care provider. Make sure you discuss any questions you have with your health care provider. Document Released: 11/06/2006 Document Revised: 06/05/2016 Document Reviewed: 11/16/2015 Elsevier Interactive Patient Education  2017 Elsevier Inc.  

## 2016-10-07 ENCOUNTER — Inpatient Hospital Stay (HOSPITAL_COMMUNITY): Payer: 59 | Admitting: Anesthesiology

## 2016-10-07 ENCOUNTER — Inpatient Hospital Stay (HOSPITAL_COMMUNITY)
Admission: AD | Admit: 2016-10-07 | Discharge: 2016-10-09 | DRG: 775 | Disposition: A | Discharge status: Home / Self Care | Payer: 59 | Source: Ambulatory Visit | Attending: Obstetrics & Gynecology | Admitting: Obstetrics & Gynecology

## 2016-10-07 ENCOUNTER — Encounter (HOSPITAL_COMMUNITY): Payer: Self-pay

## 2016-10-07 DIAGNOSIS — Z833 Family history of diabetes mellitus: Secondary | ICD-10-CM

## 2016-10-07 DIAGNOSIS — Z3A39 39 weeks gestation of pregnancy: Secondary | ICD-10-CM | POA: Diagnosis not present

## 2016-10-07 DIAGNOSIS — O9902 Anemia complicating childbirth: Secondary | ICD-10-CM | POA: Diagnosis present

## 2016-10-07 DIAGNOSIS — Z3490 Encounter for supervision of normal pregnancy, unspecified, unspecified trimester: Secondary | ICD-10-CM

## 2016-10-07 DIAGNOSIS — D649 Anemia, unspecified: Secondary | ICD-10-CM | POA: Diagnosis present

## 2016-10-07 DIAGNOSIS — Z8249 Family history of ischemic heart disease and other diseases of the circulatory system: Secondary | ICD-10-CM

## 2016-10-07 DIAGNOSIS — Z87891 Personal history of nicotine dependence: Secondary | ICD-10-CM | POA: Diagnosis not present

## 2016-10-07 DIAGNOSIS — Z3A00 Weeks of gestation of pregnancy not specified: Secondary | ICD-10-CM | POA: Diagnosis not present

## 2016-10-07 DIAGNOSIS — Z3493 Encounter for supervision of normal pregnancy, unspecified, third trimester: Secondary | ICD-10-CM | POA: Diagnosis present

## 2016-10-07 LAB — CBC
HEMATOCRIT: 36.7 % (ref 36.0–46.0)
HEMOGLOBIN: 12.6 g/dL (ref 12.0–15.0)
MCH: 28 pg (ref 26.0–34.0)
MCHC: 34.3 g/dL (ref 30.0–36.0)
MCV: 81.6 fL (ref 78.0–100.0)
Platelets: 172 10*3/uL (ref 150–400)
RBC: 4.5 MIL/uL (ref 3.87–5.11)
RDW: 13.3 % (ref 11.5–15.5)
WBC: 7.2 10*3/uL (ref 4.0–10.5)

## 2016-10-07 LAB — TYPE AND SCREEN
ABO/RH(D): A POS
ANTIBODY SCREEN: NEGATIVE

## 2016-10-07 MED ORDER — LACTATED RINGERS IV SOLN: 500.0000 mL | INTRAVENOUS | Status: DC | PRN

## 2016-10-07 MED ORDER — PRENATAL MULTIVITAMIN CH
1.0000 | ORAL_TABLET | Freq: Every day | ORAL | Status: DC
  Administered 2016-10-08: 1 via ORAL
  Filled 2016-10-07: qty 1

## 2016-10-07 MED ORDER — OXYCODONE-ACETAMINOPHEN 5-325 MG PO TABS: 1.0000 | ORAL_TABLET | ORAL | Status: DC | PRN

## 2016-10-07 MED ORDER — OXYTOCIN 40 UNITS IN LACTATED RINGERS INFUSION - SIMPLE MED: 2.5000 [IU]/h | INTRAVENOUS | Status: DC

## 2016-10-07 MED ORDER — TERBUTALINE SULFATE 1 MG/ML IJ SOLN
0.2500 mg | Freq: Once | INTRAMUSCULAR | Status: DC | PRN
  Filled 2016-10-07: qty 1

## 2016-10-07 MED ORDER — PHENYLEPHRINE 40 MCG/ML (10ML) SYRINGE FOR IV PUSH (FOR BLOOD PRESSURE SUPPORT)
80.0000 ug | PREFILLED_SYRINGE | INTRAVENOUS | Status: DC | PRN
  Filled 2016-10-07: qty 5

## 2016-10-07 MED ORDER — OXYTOCIN BOLUS FROM INFUSION
500.0000 mL | Freq: Once | INTRAVENOUS | Status: AC
  Administered 2016-10-07: 500 mL via INTRAVENOUS

## 2016-10-07 MED ORDER — SIMETHICONE 80 MG PO CHEW: 80.0000 mg | CHEWABLE_TABLET | ORAL | Status: DC | PRN

## 2016-10-07 MED ORDER — LIDOCAINE HCL (PF) 1 % IJ SOLN
INTRAMUSCULAR | Status: DC | PRN
  Administered 2016-10-07: 6 mL via EPIDURAL
  Administered 2016-10-07: 4 mL

## 2016-10-07 MED ORDER — ONDANSETRON HCL 4 MG PO TABS: 4.0000 mg | ORAL_TABLET | ORAL | Status: DC | PRN

## 2016-10-07 MED ORDER — IBUPROFEN 600 MG PO TABS
600.0000 mg | ORAL_TABLET | Freq: Four times a day (QID) | ORAL | Status: DC
  Administered 2016-10-07 – 2016-10-09 (×5): 600 mg via ORAL
  Filled 2016-10-07 (×6): qty 1

## 2016-10-07 MED ORDER — DIPHENHYDRAMINE HCL 25 MG PO CAPS: 25.0000 mg | ORAL_CAPSULE | Freq: Four times a day (QID) | ORAL | Status: DC | PRN

## 2016-10-07 MED ORDER — SOD CITRATE-CITRIC ACID 500-334 MG/5ML PO SOLN: 30.0000 mL | ORAL | Status: DC | PRN

## 2016-10-07 MED ORDER — ACETAMINOPHEN 325 MG PO TABS: 650.0000 mg | ORAL_TABLET | ORAL | Status: DC | PRN

## 2016-10-07 MED ORDER — EPHEDRINE 5 MG/ML INJ
10.0000 mg | INTRAVENOUS | Status: DC | PRN
  Filled 2016-10-07: qty 4

## 2016-10-07 MED ORDER — BENZOCAINE-MENTHOL 20-0.5 % EX AERO
1.0000 "application " | INHALATION_SPRAY | CUTANEOUS | Status: DC | PRN
  Administered 2016-10-07 – 2016-10-09 (×2): 1 via TOPICAL
  Filled 2016-10-07 (×2): qty 56

## 2016-10-07 MED ORDER — SENNOSIDES-DOCUSATE SODIUM 8.6-50 MG PO TABS
2.0000 | ORAL_TABLET | ORAL | Status: DC
  Administered 2016-10-07 – 2016-10-08 (×2): 2 via ORAL
  Filled 2016-10-07 (×2): qty 2

## 2016-10-07 MED ORDER — DIBUCAINE 1 % RE OINT: 1.0000 "application " | TOPICAL_OINTMENT | RECTAL | Status: DC | PRN

## 2016-10-07 MED ORDER — FENTANYL 2.5 MCG/ML BUPIVACAINE 1/10 % EPIDURAL INFUSION (WH - ANES)
14.0000 mL/h | INTRAMUSCULAR | Status: DC | PRN
  Administered 2016-10-07: 14 mL/h via EPIDURAL
  Filled 2016-10-07: qty 100

## 2016-10-07 MED ORDER — PHENYLEPHRINE 40 MCG/ML (10ML) SYRINGE FOR IV PUSH (FOR BLOOD PRESSURE SUPPORT)
80.0000 ug | PREFILLED_SYRINGE | INTRAVENOUS | Status: DC | PRN
  Filled 2016-10-07: qty 5
  Filled 2016-10-07: qty 10

## 2016-10-07 MED ORDER — LIDOCAINE HCL (PF) 1 % IJ SOLN
30.0000 mL | INTRAMUSCULAR | Status: AC | PRN
  Administered 2016-10-07 (×2): 30 mL via SUBCUTANEOUS
  Filled 2016-10-07 (×2): qty 30

## 2016-10-07 MED ORDER — LACTATED RINGERS IV SOLN
INTRAVENOUS | Status: DC
  Administered 2016-10-07: 12:00:00 via INTRAVENOUS

## 2016-10-07 MED ORDER — WITCH HAZEL-GLYCERIN EX PADS: 1.0000 "application " | MEDICATED_PAD | CUTANEOUS | Status: DC | PRN

## 2016-10-07 MED ORDER — ONDANSETRON HCL 4 MG/2ML IJ SOLN
4.0000 mg | Freq: Four times a day (QID) | INTRAMUSCULAR | Status: DC | PRN
Start: 2016-10-07 — End: 2016-10-07
  Administered 2016-10-07: 4 mg via INTRAVENOUS
  Filled 2016-10-07: qty 2

## 2016-10-07 MED ORDER — COCONUT OIL OIL
1.0000 "application " | TOPICAL_OIL | Status: DC | PRN
  Administered 2016-10-08 – 2016-10-09 (×2): 1 via TOPICAL
  Filled 2016-10-07 (×2): qty 120

## 2016-10-07 MED ORDER — OXYTOCIN 40 UNITS IN LACTATED RINGERS INFUSION - SIMPLE MED
1.0000 m[IU]/min | INTRAVENOUS | Status: DC
  Administered 2016-10-07: 2 m[IU]/min via INTRAVENOUS
  Administered 2016-10-07: 62.5 m[IU]/min via INTRAVENOUS
  Filled 2016-10-07: qty 1000

## 2016-10-07 MED ORDER — OXYTOCIN 40 UNITS IN LACTATED RINGERS INFUSION - SIMPLE MED: 2.5000 [IU]/h | INTRAVENOUS | Status: DC | PRN

## 2016-10-07 MED ORDER — ONDANSETRON HCL 4 MG/2ML IJ SOLN: 4.0000 mg | INTRAMUSCULAR | Status: DC | PRN

## 2016-10-07 MED ORDER — TETANUS-DIPHTH-ACELL PERTUSSIS 5-2.5-18.5 LF-MCG/0.5 IM SUSP: 0.5000 mL | Freq: Once | INTRAMUSCULAR | Status: DC

## 2016-10-07 MED ORDER — DIPHENHYDRAMINE HCL 50 MG/ML IJ SOLN: 12.5000 mg | INTRAMUSCULAR | Status: DC | PRN

## 2016-10-07 MED ORDER — OXYCODONE-ACETAMINOPHEN 5-325 MG PO TABS: 2.0000 | ORAL_TABLET | ORAL | Status: DC | PRN

## 2016-10-07 MED ORDER — ZOLPIDEM TARTRATE 5 MG PO TABS: 5.0000 mg | ORAL_TABLET | Freq: Every evening | ORAL | Status: DC | PRN

## 2016-10-07 MED ORDER — LACTATED RINGERS IV SOLN
500.0000 mL | Freq: Once | INTRAVENOUS | Status: AC
  Administered 2016-10-07: 500 mL via INTRAVENOUS

## 2016-10-07 NOTE — Anesthesia Pain Management Evaluation Note (Signed)
  CRNA Pain Management Visit Note  Patient: Crystal LeveringBrittani B Brogden, 25 y.o., female  "Hello I am a [facility] the anesthesia [facility]. We have an anesthesia team available at all times to provide care throughout the hospital, including epidural management and anesthesia for C-section. I don't know your plan for the delivery whether it a natural birth, water birth, IV sedation, nitrous supplementation, doula or epidural, but we want to meet your pain goals."   1.Was your pain managed to your expectations on prior hospitalizations?   Yes   2.What is your expectation for pain management during this hospitalization?     Labor support without medications, Epidural, IV pain meds and Nitrous Oxide  3.How can we help you reach that goal? Requests to start natural, but be open to other options  Record the patient's initial score and the patient's pain goal.   Pain: 0  Pain Goal: 6 [facility] wants you to be able to say your pain was always managed very well.  Tacoma Merida 10/07/2016

## 2016-10-07 NOTE — Anesthesia Preprocedure Evaluation (Addendum)
Anesthesia Evaluation  Patient identified by MRN, date of birth, ID band Patient awake    Reviewed: Allergy & Precautions, H&P , Patient's Chart, lab work & pertinent test results  Airway Mallampati: III TM Distance: >3 FB Neck ROM: full    Dental  (+) Teeth Intact   Pulmonary former smoker,  breath sounds clear to auscultation        Cardiovascular Rhythm:regular Rate:Normal     Neuro/Psych    GI/Hepatic   Endo/Other  Morbid obesity  Renal/GU      Musculoskeletal   Abdominal   Peds  Hematology   Anesthesia Other Findings       Reproductive/Obstetrics (+) Pregnancy                           Anesthesia Physical Anesthesia Plan  ASA: III  Anesthesia Plan: Epidural   Post-op Pain Management:    Induction:   Airway Management Planned:   Additional Equipment:   Intra-op Plan:   Post-operative Plan:   Informed Consent: I have reviewed the patients History and Physical, chart, labs and discussed the procedure including the risks, benefits and alternatives for the proposed anesthesia with the patient or authorized representative who has indicated his/her understanding and acceptance.   Dental Advisory Given  Plan Discussed with:   Anesthesia Plan Comments: (Labs checked- platelets confirmed with RN in room. Fetal heart tracing, per RN, reported to be stable enough for sitting procedure. Discussed epidural, and patient consents to the procedure:  included risk of possible headache,backache, failed block, allergic reaction, and nerve injury. This patient was asked if she had any questions or concerns before the procedure started.)        Anesthesia Quick Evaluation  

## 2016-10-07 NOTE — Progress Notes (Signed)
Subjective: Doing well, pain none, UCs rare, mild  Anesthesia none Induction Pito   Objective: BP 132/78   Pulse 92   Temp 97.8 F (36.6 C) (Oral)   Resp 18   Ht 5\' 6"  (1.676 m)   Wt 250 lb (113.4 kg)   BMI 40.35 kg/m    FHT:  FHR: 140's bpm, variability: moderate,  accelerations:  Present,  decelerations:  Absent UC:   Irregular, mild VE:   3/90%/Vtx/-1 fixed.  AROM clear AF.   Assessment / Plan: Induction of labor due to Multip with favorable cervix at 39+ wks, elective.,  progressing well on pitocin.  AROM done, clear AF.  Fetal Wellbeing:  Category I Pain Control:  Labor support without medications  Anticipated MOD:  NSVD  Crystal Sheppard 10/07/2016, 4:14 PM

## 2016-10-07 NOTE — Anesthesia Procedure Notes (Signed)
Epidural Patient location during procedure: OB Start time: 10/07/2016 5:25 PM End time: 10/07/2016 5:39 PM  Staffing Anesthesiologist: Cristela BlueJACKSON, Dazha Kempa  Preanesthetic Checklist Completed: patient identified, site marked, surgical consent, pre-op evaluation, timeout performed, IV checked, risks and benefits discussed and monitors and equipment checked  Epidural Patient position: sitting Prep: site prepped and draped and DuraPrep Patient monitoring: continuous pulse ox and blood pressure Approach: midline Location: L3-L4 Injection technique: LOR air  Needle:  Needle type: Tuohy  Needle gauge: 17 G Needle length: 9 cm and 9 Needle insertion depth: 7 cm Catheter type: closed end flexible Catheter size: 19 Gauge Catheter at skin depth: 14 cm Test dose: negative  Assessment Events: blood not aspirated, injection not painful, no injection resistance, negative IV test and no paresthesia  Additional Notes Dosing of Epidural:  1st dose, through catheter ............................................Marland Kitchen.  Xylocaine 40 mg  2nd dose, through catheter, after waiting 3 minutes........Marland Kitchen.Xylocaine 60 mg    As each dose occurred, patient was free of IV sx; and patient exhibited no evidence of SA injection.  Patient is more comfortable after epidural dosed. Please see RN's note for documentation of vital signs,and FHR which are stable.  Patient reminded not to try to ambulate with numb legs, and that an RN must be present when she attempts to get up.

## 2016-10-07 NOTE — H&P (Signed)
Crystal Sheppard is a 25 y.o. female G3P1011 9180w1d presenting for Elective induction.  HPP/HPI:  Good FMs, no vaginal bleeding, no AF leak.  No reg UC.  No PEC Sx.  Favorable cervix in office.  Previous SVD.  OB History    Gravida Para Term Preterm AB Living   3 1 1  0 1 1   SAB TAB Ectopic Multiple Live Births   0 1 0 0 1     Past Medical History:  Diagnosis Date  . Dysmetabolic syndrome X   . Hirsutism   . Migraines   . Other specified complication, antepartum(646.83)    headaches  . Polycystic ovarian disease   . Polycystic ovaries    History reviewed. No pertinent surgical history. Family History: family history includes Asthma in her brother and mother; Diabetes in her paternal grandmother; Hypertension in her brother and mother; Mental retardation in her cousin. Social History:  reports that she has quit smoking. She has quit using smokeless tobacco. She reports that she drinks alcohol. She reports that she does not use drugs.  No Known Allergies  Dilation: 3 Effacement (%): 90 Station: -2 Exam by:: , RN   Blood pressure (!) 150/81, pulse (!) 103, temperature 98.3 F (36.8 C), temperature source Oral, resp. rate 18, height 5\' 6"  (1.676 m), weight 250 lb (113.4 kg). Exam Physical Exam   FHR 140's with good variability, accelerations present.  No deceleration.  Occasional UCs.  HPP:  Patient Active Problem List   Diagnosis Date Noted  . Pregnancy 10/07/2016  . NSVD (normal spontaneous vaginal delivery) 09/26/2012  . Hx of maternal laceration, 3rd degree, currently pregnant 09/26/2012  . Anemia of mother in pregnancy, delivered 09/26/2012    Prenatal labs: ABO, Rh:  A pos Antibody:  Neg Rubella: Immune RPR:   NR HBsAg:  NR  HIV:   NR Genetic testing:Ultrascreen wnl, AFP1 neg US anato: wnl, but FH not well seen.  Fetal Echo wnl 1 hr GTT: 123 wnl GBS:   neg  Assessment/Plan: G3P1A1 39 1/7 wks for Elective Induction.  Favorable cervix/presentation.  FHR Cat  1.  Pitocin/AROM.  Epidural PRN.  Expectant management towards probable vaginal delivery.   Marie-Lyne Coston Mandato 10/07/2016, 12:28 PM

## 2016-10-08 LAB — CBC
HEMATOCRIT: 28.6 % — AB (ref 36.0–46.0)
HEMOGLOBIN: 9.8 g/dL — AB (ref 12.0–15.0)
MCH: 27.8 pg (ref 26.0–34.0)
MCHC: 34.3 g/dL (ref 30.0–36.0)
MCV: 81 fL (ref 78.0–100.0)
Platelets: 153 10*3/uL (ref 150–400)
RBC: 3.53 MIL/uL — ABNORMAL LOW (ref 3.87–5.11)
RDW: 13.3 % (ref 11.5–15.5)
WBC: 9.2 10*3/uL (ref 4.0–10.5)

## 2016-10-08 LAB — RPR: RPR: NONREACTIVE

## 2016-10-08 MED ORDER — MAGNESIUM OXIDE 400 (241.3 MG) MG PO TABS
400.0000 mg | ORAL_TABLET | Freq: Every day | ORAL | Status: DC
  Administered 2016-10-08 – 2016-10-09 (×2): 400 mg via ORAL
  Filled 2016-10-08 (×2): qty 1

## 2016-10-08 MED ORDER — POLYSACCHARIDE IRON COMPLEX 150 MG PO CAPS
150.0000 mg | ORAL_CAPSULE | Freq: Every day | ORAL | Status: DC
  Administered 2016-10-08 – 2016-10-09 (×2): 150 mg via ORAL
  Filled 2016-10-08 (×2): qty 1

## 2016-10-08 NOTE — Anesthesia Postprocedure Evaluation (Signed)
Anesthesia Post Note  Patient: Crystal Sheppard  Procedure(s) Performed: * No procedures listed *  Patient location during evaluation: Mother Baby Anesthesia Type: Epidural Level of consciousness: awake and alert Pain management: pain level controlled Vital Signs Assessment: post-procedure vital signs reviewed and stable Respiratory status: spontaneous breathing, nonlabored ventilation and respiratory function stable Cardiovascular status: stable Postop Assessment: no headache, no backache and epidural receding Anesthetic complications: no        Last Vitals:  Vitals:   10/08/16 0154 10/08/16 0647  BP: 135/73 125/71  Pulse: 81 98  Resp: 18 18  Temp: 36.8 C 36.8 C    Last Pain:  Vitals:   10/08/16 0715  TempSrc:   PainSc: 0-No pain   Pain Goal:                 Junious SilkGILBERT,Elison Worrel

## 2016-10-08 NOTE — Lactation Note (Signed)
This note was copied from a baby's chart. Lactation Consultation Note Moms 2nd baby, BF her 1st child who is now 714 yrs old for 3 months until her milk supply depleted. Has dx: of PCOS and hirutism. No facial hair noted.  Mom has large pendulum breast w/flat nipples. Shells given to wear in bra today.  Compressible to obtain deep latch if baby will hold in mouth. Assisted in football position for deep latch. Mom denies painful latch. Mom states she prefers football as well. Wash cloth placed under breast for lifting support. Breast massage and hand expression w/some colostrum.  Mom has DEBP at home. Mom shown how to use DEBP & how to disassemble, clean, & reassemble parts. Mom has DEBP at home. Mom knows to pump q3h for 15-20 min. Discuss the need for pumping to stimulate lactation d/t Hx; mom states understanding.  Mom encouraged to feed baby 8-12 times/24 hours and with feeding cues. Mom encouraged to waken baby for feeds.  Educated about newborn behavior encouraged STS, I&O, cluster feeding, supply and demand. WH/LC brochure given w/resources, support groups and LC services. Patient Name: Crystal Merleen MillinerBrittani Beville NWGNF'AToday's Date: 10/08/2016 Reason for consult: Initial assessment   Maternal Data Has patient been taught Hand Expression?: Yes Does the patient have breastfeeding experience prior to this delivery?: Yes  Feeding Feeding Type: Breast Fed Length of feed: 20 min (in pm)  LATCH Score/Interventions Latch: Grasps breast easily, tongue down, lips flanged, rhythmical sucking. Intervention(s): Adjust position;Assist with latch;Breast massage;Breast compression  Audible Swallowing: None Intervention(s): Skin to skin;Hand expression  Type of Nipple: Flat Intervention(s): Shells;Double electric pump  Comfort (Breast/Nipple): Soft / non-tender     Hold (Positioning): Assistance needed to correctly position infant at breast and maintain latch. Intervention(s): Breastfeeding basics  reviewed;Support Pillows;Position options;Skin to skin  LATCH Score: 6  Lactation Tools Discussed/Used Tools: Shells;Pump Shell Type: Inverted Breast pump type: Double-Electric Breast Pump WIC Program: No Pump Review: Setup, frequency, and cleaning;Milk Storage Initiated by:: Peri JeffersonL. Jenese Mischke RN IBCLC Date initiated:: 10/08/16   Consult Status Consult Status: Follow-up Date: 10/08/16 Follow-up type: In-patient    Charyl DancerCARVER, Esai Stecklein G 10/08/2016, 6:17 AM

## 2016-10-08 NOTE — Progress Notes (Signed)
PPD # 1 SVD Information for the patient's newborn:  Henderson NewcomerDixon, Girl Aviah [[id number]][[id number]]  female    breast feeding   Baby name: TurkeyVictoria  S:  Reports feeling well, a bit sore in perineum.             Tolerating po/ No nausea or vomiting             Bleeding is moderate             Pain controlled with ibuprofen (OTC)             Up ad lib / ambulatory / voiding without difficulties        O:  A & O x 3, in no apparent distress              VS:  Vitals:   10/07/16 2056 10/07/16 2155 10/08/16 0154 10/08/16 0647  BP: 125/73 (!) 141/73 135/73 125/71  Pulse: 84 89 81 98  Resp: 18 18 18 18   Temp: 98.3 F (36.8 C) 97.9 F (36.6 C) 98.3 F (36.8 C) 98.2 F (36.8 C)  TempSrc: Oral Oral Oral Oral  SpO2:      Weight:      Height:        LABS:  Recent Labs  10/07/16 1208 10/08/16 0506  WBC 7.2 9.2  HGB 12.6 9.8*  HCT 36.7 28.6*  PLT 172 153    Blood type: --/--/A POS (12/18 1208)  Rubella:     I&O: I/O last 3 completed shifts: In: -  Out: 300 [Blood:300]          No intake/output data recorded.  Lungs: Clear and unlabored  Heart: regular rate and rhythm / no murmurs  Abdomen: soft, non-tender, non-distended             Fundus: firm, non-tender, U-1  Perineum: no edema, repair intact  Lochia: small  Extremities: trace edema, no calf pain or tenderness    A/P: PPD # 1 25 y.o., [id number]   Principal Problem:   Postpartum care following vaginal delivery (12/18) Active Problems:   Anemia of mother in pregnancy, delivered   Second-degree perineal laceration, with delivery  Start oral Fe and Mag Ox  Doing well - stable status  Routine post partum orders  Anticipate discharge tomorrow    Neta Mendsaniela C Paul, MSN, CNM 10/08/2016, 8:55 AM

## 2016-10-09 MED ORDER — FERROUS SULFATE 325 (65 FE) MG PO TABS: 325.0000 mg | ORAL_TABLET | Freq: Every day | ORAL | 3 refills | Status: DC

## 2016-10-09 MED ORDER — MAGNESIUM OXIDE 400 (241.3 MG) MG PO TABS: 400.0000 mg | ORAL_TABLET | Freq: Every day | ORAL | 3 refills | Status: DC

## 2016-10-09 MED ORDER — IBUPROFEN 600 MG PO TABS: 600.0000 mg | ORAL_TABLET | Freq: Four times a day (QID) | ORAL | 0 refills | Status: DC

## 2016-10-09 NOTE — Lactation Note (Signed)
This note was copied from a baby's chart. Lactation Consultation Note: Experienced BF mom. Dr Tommi Emerywisleton in to see baby. Mom reports she nursed for 15 min prior to MD visit. Mom easily latched baby by herself. Reports nipples are feeling better today. Still nursing as I left room. No questions at present. Reviewed our phone number, OP appointments and BFSG as resources for support after DC. To call prn  Patient Name: Crystal Sheppard MVHQI'OToday's Date: 10/09/2016 Reason for consult: Follow-up assessment   Maternal Data Formula Feeding for Exclusion: No Has patient been taught Hand Expression?: Yes Does the patient have breastfeeding experience prior to this delivery?: Yes  Feeding Feeding Type: Breast Fed  LATCH Score/Interventions Latch: Grasps breast easily, tongue down, lips flanged, rhythmical sucking.  Audible Swallowing: A few with stimulation  Type of Nipple: Everted at rest and after stimulation  Comfort (Breast/Nipple): Filling, red/small blisters or bruises, mild/mod discomfort  Problem noted: Cracked, bleeding, blisters, bruises Interventions  (Cracked/bleeding/bruising/blister): Expressed breast milk to nipple  Hold (Positioning): No assistance needed to correctly position infant at breast.  LATCH Score: 8  Lactation Tools Discussed/Used [facility] Program: No   Consult Status Consult Status: Complete    [name], [name] 10/09/2016, 9:58 AM

## 2016-10-09 NOTE — Discharge Summary (Signed)
OB Discharge Summary     Patient Name: Crystal Sheppard DOB: 03/14/1991 MRN: [id number]  Date of admission: 10/07/2016 Delivering MD: Genia DelLAVOIE, MARIE-LYNE   Date of discharge: 10/09/2016  Admitting diagnosis: ELECTIVE INDUCTION Intrauterine pregnancy: 6745w1d     Secondary diagnosis:  Principal Problem:   Postpartum care following vaginal delivery (12/18) Active Problems:   Anemia of mother in pregnancy, delivered   Second-degree perineal laceration, with delivery  Additional problems: none     Discharge diagnosis: Term Pregnancy Delivered                                                                                                Post partum procedures:none  Augmentation: AROM and Pitocin  Complications: None  Hospital course:  Induction of Labor With Vaginal Delivery   25 y.o. yo [id number] at 7745w1d was admitted to the hospital 10/07/2016 for induction of labor.  Indication for induction: Favorable cervix at term.  Patient had an uncomplicated labor course as follows: Membrane Rupture Time/Date: 4:10 PM ,10/07/2016   Intrapartum Procedures: Episiotomy: None [1]                                         Lacerations:  2nd degree [3];Perineal [11];Labial [10]  Patient had delivery of a Viable infant.  Information for the patient's newborn:  Henderson Sheppard, Crystal Sheppard [[id number]][[id number]]      10/07/2016  Details of delivery can be found in separate delivery note.  Patient had a routine postpartum course. Patient is discharged home 10/09/16.   Physical exam Vitals:   10/08/16 0154 10/08/16 0647 10/08/16 1813 10/09/16 0608  BP: 135/73 125/71 136/76 127/78  Pulse: 81 98 88 91  Resp: 18 18 17 18   Temp: 98.3 F (36.8 C) 98.2 F (36.8 C) 98.3 F (36.8 C) 98.2 F (36.8 C)  TempSrc: Oral Oral Oral Oral  SpO2:      Weight:      Height:       General: alert, cooperative and no distress Lochia: appropriate Uterine Fundus: firm DVT Evaluation: No evidence of DVT seen on physical  exam. Negative Homan's sign. No cords or calf tenderness. Trace calf/ankle edema is present Labs: Lab Results  Component Value Date   WBC 9.2 10/08/2016   HGB 9.8 (L) 10/08/2016   HCT 28.6 (L) 10/08/2016   MCV 81.0 10/08/2016   PLT 153 10/08/2016   CMP Latest Ref Rng & Units 06/28/2016  Glucose 65 - 99 mg/dL 295(M102(H)  BUN 6 - 20 mg/dL 7  Creatinine 8.410.44 - 3.241.00 mg/dL 4.010.50  Sodium 027135 - 253145 mmol/L 138  Potassium 3.5 - 5.1 mmol/L 3.8  Chloride 101 - 111 mmol/L 105  CO2 22 - 32 mmol/L -  Calcium 8.9 - 10.3 mg/dL -  Total Protein 6.0 - 8.3 g/dL -  Total Bilirubin 0.3 - 1.2 mg/dL -  Alkaline Phos 39 - 664117 U/L -  AST 0 - 37 U/L -  ALT 0 - 35 U/L -    Discharge  instruction: per After Visit Summary and "Baby and Me Booklet".  After visit meds:  Allergies as of 10/09/2016   No Known Allergies     Medication List    TAKE these medications   acetaminophen 500 MG chewable tablet Commonly known as:  TYLENOL Chew 1,000 mg by mouth every 6 (six) hours as needed for pain.   ferrous sulfate 325 (65 FE) MG tablet Commonly known as:  FERROUSUL Take 1 tablet (325 mg total) by mouth daily with breakfast.   ibuprofen 600 MG tablet Commonly known as:  ADVIL,MOTRIN Take 1 tablet (600 mg total) by mouth every 6 (six) hours.   ipratropium 0.03 % nasal spray Commonly known as:  ATROVENT Place 2 sprays into both nostrils every 12 (twelve) hours.   magnesium oxide 400 (241.3 Mg) MG tablet Commonly known as:  MAG-OX Take 1 tablet (400 mg total) by mouth daily.   prenatal multivitamin Tabs tablet Take 1 tablet by mouth daily at 12 noon.       Diet: routine diet  Activity: Advance as tolerated. Pelvic rest for 6 weeks.   Outpatient follow up:6 weeks Follow up Appt:No future appointments. Follow up Visit:No Follow-up on file.  Postpartum contraception: Undecided  Newborn Data: Live born female on 10/07/2016 Birth Weight: 7 lb 9.2 oz (3436 g) APGAR: 9, 9  Baby Feeding:  Breast Disposition:home with mother   10/09/2016 Raelyn MoraAWSON, Lyndzie Zentz, Judie PetitM, CNM

## 2016-10-09 NOTE — Progress Notes (Signed)
PPD # 2 SVD Information for the patient's newborn:  Crystal Sheppard, Crystal Sheppard [[id number]][[id number]]  female    breast feeding   Crystal Sheppard  S:  Reports feeling well, a bit sore in perineum.             Tolerating po/ No nausea or vomiting             Bleeding is moderate             Pain controlled with ibuprofen (OTC)             Up ad lib / ambulatory / voiding without difficulties        O:  A & O x 3, in no apparent distress              VS:  Vitals:   10/08/16 0154 10/08/16 0647 10/08/16 1813 10/09/16 0608  BP: 135/73 125/71 136/76 127/78  Pulse: 81 98 88 91  Resp: 18 18 17 18   Temp: 98.3 F (36.8 C) 98.2 F (36.8 C) 98.3 F (36.8 C) 98.2 F (36.8 C)  TempSrc: Oral Oral Oral Oral  SpO2:      Weight:      Height:        LABS:   Recent Labs  10/07/16 1208 10/08/16 0506  WBC 7.2 9.2  HGB 12.6 9.8*  HCT 36.7 28.6*  PLT 172 153    Blood type: --/--/A POS (12/18 1208)  Rubella:     I&O: No intake/output data recorded.          No intake/output data recorded.  Lungs: Clear and unlabored  Heart: regular rate and rhythm / no murmurs  Abdomen: soft, non-tender, non-distended             Fundus: firm, non-tender, U-2  Perineum: no edema, repair intact  Lochia: small  Extremities: trace edema, no calf pain or tenderness    A/P: PPD # 2 25 y.o., [id number]   Principal Problem:   Postpartum care following vaginal delivery (12/18) Active Problems:   Anemia of mother in pregnancy, delivered   Second-degree perineal laceration, with delivery    Doing well - stable status  Routine post partum orders  Continue oral Fe and Mag Ox  D/C home today    Crystal Sheppard, Crystal Sheppard, M, MSN, CNM 10/09/2016, 9:05 AM

## 2016-10-09 NOTE — Discharge Instructions (Signed)
Postpartum Depression and Baby Blues °The postpartum period begins right after the birth of a baby. During this time, there is often a great amount of joy and excitement. It is also a time of many changes in the life of the parents. Regardless of how many times a mother gives birth, each child brings new challenges and dynamics to the family. It is not unusual to have feelings of excitement along with confusing shifts in moods, emotions, and thoughts. All mothers are at risk of developing postpartum depression or the "baby blues." These mood changes can occur right after giving birth, or they may occur many months after giving birth. The baby blues or postpartum depression can be mild or severe. Additionally, postpartum depression can go away rather quickly, or it can be a long-term condition. °What are the causes? °Raised hormone levels and the rapid drop in those levels are thought to be a main cause of postpartum depression and the baby blues. A number of hormones change during and after pregnancy. Estrogen and progesterone usually decrease right after the delivery of your baby. The levels of thyroid hormone and various cortisol steroids also rapidly drop. Other factors that play a role in these mood changes include major life events and genetics. °What increases the risk? °If you have any of the following risks for the baby blues or postpartum depression, know what symptoms to watch out for during the postpartum period. Risk factors that may increase the likelihood of getting the baby blues or postpartum depression include: °· Having a personal or family history of depression. °· Having depression while being pregnant. °· Having premenstrual mood issues or mood issues related to oral contraceptives. °· Having a lot of life stress. °· Having marital conflict. °· Lacking a social support network. °· Having a baby with special needs. °· Having health problems, such as diabetes. °What are the signs or  symptoms? °Symptoms of baby blues include: °· Brief changes in mood, such as going from extreme happiness to sadness. °· Decreased concentration. °· Difficulty sleeping. °· Crying spells, tearfulness. °· Irritability. °· Anxiety. °Symptoms of postpartum depression typically begin within the first month after giving birth. These symptoms include: °· Difficulty sleeping or excessive sleepiness. °· Marked weight loss. °· Agitation. °· Feelings of worthlessness. °· Lack of interest in activity or food. °Postpartum psychosis is a very serious condition and can be dangerous. Fortunately, it is rare. Displaying any of the following symptoms is cause for immediate medical attention. Symptoms of postpartum psychosis include: °· Hallucinations and delusions. °· Bizarre or disorganized behavior. °· Confusion or disorientation. °How is this diagnosed? °A diagnosis is made by an evaluation of your symptoms. There are no medical or lab tests that lead to a diagnosis, but there are various questionnaires that a health care provider may use to identify those with the baby blues, postpartum depression, or psychosis. Often, a screening tool called the Edinburgh Postnatal Depression Scale is used to diagnose depression in the postpartum period. °How is this treated? °The baby blues usually goes away on its own in 1-2 weeks. Social support is often all that is needed. You will be encouraged to get adequate sleep and rest. Occasionally, you may be given medicines to help you sleep. °Postpartum depression requires treatment because it can last several months or longer if it is not treated. Treatment may include individual or group therapy, medicine, or both to address any social, physiological, and psychological factors that may play a role in the depression. Regular exercise, a   healthy diet, rest, and social support may also be strongly recommended. °Postpartum psychosis is more serious and needs treatment right away. Hospitalization is  often needed. °Follow these instructions at home: °· Get as much rest as you can. Nap when the baby sleeps. °· Exercise regularly. Some women find yoga and walking to be beneficial. °· Eat a balanced and nourishing diet. °· Do little things that you enjoy. Have a cup of tea, take a bubble bath, read your favorite magazine, or listen to your favorite music. °· Avoid alcohol. °· Ask for help with household chores, cooking, grocery shopping, or running errands as needed. Do not try to do everything. °· Talk to people close to you about how you are feeling. Get support from your partner, family members, friends, or other new moms. °· Try to stay positive in how you think. Think about the things you are grateful for. °· Do not spend a lot of time alone. °· Only take over-the-counter or prescription medicine as directed by your health care provider. °· Keep all your postpartum appointments. °· Let your health care provider know if you have any concerns. °Contact a health care provider if: °You are having a reaction to or problems with your medicine. °Get help right away if: °· You have suicidal feelings. °· You think you may harm the baby or someone else. °This information is not intended to replace advice given to you by your health care provider. Make sure you discuss any questions you have with your health care provider. °Document Released: 07/11/2004 Document Revised: 03/14/2016 Document Reviewed: 07/19/2013 °Elsevier Interactive Patient Education © 2017 [employer] °Postpartum Care After Vaginal Delivery °The period of time right after you deliver your newborn is called the postpartum period. °What kind of medical care will I receive? °· You may continue to receive fluids and medicines through an IV tube inserted into one of your veins. °· If an incision was made near your vagina (episiotomy) or if you had some vaginal tearing during delivery, cold compresses may be placed on your episiotomy or your tear. This  helps to reduce pain and swelling. °· You may be given a squirt bottle to use when you go to the bathroom. You may use this until you are comfortable wiping as usual. To use the squirt bottle, follow these steps: °¨ Before you urinate, fill the squirt bottle with warm water. Do not use hot water. °¨ After you urinate, while you are sitting on the toilet, use the squirt bottle to rinse the area around your urethra and vaginal opening. This rinses away any urine and blood. °¨ You may do this instead of wiping. As you start healing, you may use the squirt bottle before wiping yourself. Make sure to wipe gently. °¨ Fill the squirt bottle with clean water every time you use the bathroom. °· You will be given sanitary pads to wear. °How can I expect to feel? °· You may not feel the need to urinate for several hours after delivery. °· You will have some soreness and pain in your abdomen and vagina. °· If you are breastfeeding, you may have uterine contractions every time you breastfeed for up to several weeks postpartum. Uterine contractions help your uterus return to its normal size. °· It is normal to have vaginal bleeding (lochia) after delivery. The amount and appearance of lochia is often similar to a menstrual period in the first week after delivery. It will gradually decrease over the next few weeks to a   dry, yellow-brown discharge. For most women, lochia stops completely by 6-8 weeks after delivery. Vaginal bleeding can vary from woman to woman.  Within the first few days after delivery, you may have breast engorgement. This is when your breasts feel heavy, full, and uncomfortable. Your breasts may also throb and feel hard, tightly stretched, warm, and tender. After this occurs, you may have milk leaking from your breasts.Your health care provider can help you relieve discomfort due to breast engorgement. Breast engorgement should go away within a few days.  You may feel more sad or worried than normal due to  hormonal changes after delivery. These feelings should not last more than a few days. If these feelings do not go away after several days, speak with your health care provider. How should I care for myself?  Tell your health care provider if you have pain or discomfort.  Drink enough water to keep your urine clear or pale yellow.  Wash your hands thoroughly with soap and water for at least 20 seconds after changing your sanitary pads, after using the toilet, and before holding or feeding your baby.  If you are not breastfeeding, avoid touching your breasts a lot. Doing this can make your breasts produce more milk.  If you become weak or lightheaded, or you feel like you might faint, ask for help before:  Getting out of bed.  Showering.  Change your sanitary pads frequently. Watch for any changes in your flow, such as a sudden increase in volume, a change in color, the passing of large blood clots. If you pass a blood clot from your vagina, save it to show to your health care provider. Do not flush blood clots down the toilet without having your health care provider look at them.  Make sure that all your vaccinations are up to date. This can help protect you and your baby from getting certain diseases. You may need to have immunizations done before you leave the hospital.  If desired, talk with your health care provider about methods of family planning or birth control (contraception). How can I start bonding with my baby? Spending as much time as possible with your baby is very important. During this time, you and your baby can get to know each other and develop a bond. Having your baby stay with you in your room (rooming in) can give you time to get to know your baby. Rooming in can also help you become comfortable caring for your baby. Breastfeeding can also help you bond with your baby. How can I plan for returning home with my baby?  Make sure that you have a car seat installed in your  vehicle.  Your car seat should be checked by a certified car seat installer to make sure that it is installed safely.  Make sure that your baby fits into the car seat safely.  Ask your health care provider any questions you have about caring for yourself or your baby. Make sure that you are able to contact your health care provider with any questions after leaving the hospital. This information is not intended to replace advice given to you by your health care provider. Make sure you discuss any questions you have with your health care provider. Document Released: 08/04/2007 Document Revised: 03/11/2016 Document Reviewed: 09/11/2015 Elsevier Interactive Patient Education  2017 [employer] Inc[employer] Mastitis Mastitis is inflammation of the breast tissue. It occurs most often in women who are breastfeeding, but it can also affect other women, and  even sometimes men. CAUSES  Mastitis is usually caused by a bacterial infection. Bacteria enter the breast tissue through cuts or openings in the skin. Typically, this occurs with breastfeeding because of cracked or irritated skin. Sometimes, it can occur even when there is no opening in the skin. It can be associated with plugged milk (lactiferous) ducts. Nipple piercing can also lead to mastitis. Also, some forms of breast cancer can cause mastitis. SIGNS AND SYMPTOMS   Swelling, redness, tenderness, and pain in an area of the breast.  Swelling of the glands under the arm on the same side.  Fever. If an infection is allowed to progress, a collection of pus (abscess) may develop. DIAGNOSIS  Your health care provider can usually diagnose mastitis based on your symptoms and a physical exam. Tests may be done to help confirm the diagnosis. These may include:   Removal of pus from the breast by applying pressure to the area. This pus can be examined in the lab to determine which bacteria are present. If an abscess has developed, the fluid in the abscess can be  removed with a needle. This can also be used to confirm the diagnosis and determine the bacteria present. In most cases, pus will not be present.  Blood tests to determine if your body is fighting a bacterial infection.  Mammogram or ultrasound tests to rule out other problems or diseases. TREATMENT  Antibiotic medicine is used to treat a bacterial infection. Your health care provider will determine which bacteria are most likely causing the infection and will select an appropriate antibiotic. This is sometimes changed based on the results of tests performed to identify the bacteria, or if there is no response to the antibiotic selected. Antibiotics are usually given by mouth. You may also be given medicine for pain. Mastitis that occurs with breastfeeding will sometimes go away on its own, so your health care provider may choose to wait 24 hours after first seeing you to decide whether a prescription medicine is needed. HOME CARE INSTRUCTIONS   Only take over-the-counter or prescription medicines for pain, fever, or discomfort as directed by your health care provider.  If your health care provider prescribed an antibiotic, take the medicine as directed. Make sure you finish it even if you start to feel better.  Do not wear a tight or underwire bra. Wear a soft, supportive bra.  Increase your fluid intake, especially if you have a fever.  Women who are breastfeeding should follow these instructions:  Continue to empty the breast. Your health care provider can tell you whether this milk is safe for your infant or needs to be thrown out. You may be told to stop nursing until your health care provider thinks it is safe for your baby. Use a breast pump if you are advised to stop nursing.  Keep your nipples clean and dry.  Empty the first breast completely before going to the other breast. If your baby is not emptying your breasts completely for some reason, use a breast pump to empty your  breasts.  If you go back to work, pump your breasts while at work to stay in time with your nursing schedule.  Avoid allowing your breasts to become overly filled with milk (engorged). SEEK MEDICAL CARE IF:   You have pus-like discharge from the breast.  Your symptoms do not improve with the treatment prescribed by your health care provider within 2 days. SEEK IMMEDIATE MEDICAL CARE IF:   Your pain and swelling  are getting worse.  You have pain that is not controlled with medicine.  You have a red line extending from the breast toward your armpit.  You have a fever or persistent symptoms for more than 2-3 days.  You have a fever and your symptoms suddenly get worse. This information is not intended to replace advice given to you by your health care provider. Make sure you discuss any questions you have with your health care provider. Document Released: 10/07/2005 Document Revised: 10/12/2013 Document Reviewed: 05/07/2013 Elsevier Interactive Patient Education  2017 [employer]. Breastfeeding Deciding to breastfeed is one of the best choices you can make for you and your baby. A change in hormones during pregnancy causes your breast tissue to grow and increases the number and size of your milk ducts. These hormones also allow proteins, sugars, and fats from your blood supply to make breast milk in your milk-producing glands. Hormones prevent breast milk from being released before your baby is born as well as prompt milk flow after birth. Once breastfeeding has begun, thoughts of your baby, as well as his or her sucking or crying, can stimulate the release of milk from your milk-producing glands. Benefits of breastfeeding For Your Baby  Your first milk (colostrum) helps your baby's digestive system function better.  There are antibodies in your milk that help your baby fight off infections.  Your baby has a lower incidence of asthma, allergies, and sudden infant death  syndrome.  The nutrients in breast milk are better for your baby than infant formulas and are designed uniquely for your babys needs.  Breast milk improves your baby's brain development.  Your baby is less likely to develop other conditions, such as childhood obesity, asthma, or type 2 diabetes mellitus. For You  Breastfeeding helps to create a very special bond between you and your baby.  Breastfeeding is convenient. Breast milk is always available at the correct temperature and costs nothing.  Breastfeeding helps to burn calories and helps you lose the weight gained during pregnancy.  Breastfeeding makes your uterus contract to its prepregnancy size faster and slows bleeding (lochia) after you give birth.  Breastfeeding helps to lower your risk of developing type 2 diabetes mellitus, osteoporosis, and breast or ovarian cancer later in life. Signs that your baby is hungry Early Signs of Hunger  Increased alertness or activity.  Stretching.  Movement of the head from side to side.  Movement of the head and opening of the mouth when the corner of the mouth or cheek is stroked (rooting).  Increased sucking sounds, smacking lips, cooing, sighing, or squeaking.  Hand-to-mouth movements.  Increased sucking of fingers or hands. Late Signs of Hunger  Fussing.  Intermittent crying. Extreme Signs of Hunger  Signs of extreme hunger will require calming and consoling before your baby will be able to breastfeed successfully. Do not wait for the following signs of extreme hunger to occur before you initiate breastfeeding:  Restlessness.  A loud, strong cry.  Screaming. Breastfeeding basics  Breastfeeding Initiation  Find a comfortable place to sit or lie down, with your neck and back well supported.  Place a pillow or rolled up blanket under your baby to bring him or her to the level of your breast (if you are seated). Nursing pillows are specially designed to help support  your arms and your baby while you breastfeed.  Make sure that your baby's abdomen is facing your abdomen.  Gently massage your breast. With your fingertips, massage from your  chest wall toward your nipple in a circular motion. This encourages milk flow. You may need to continue this action during the feeding if your milk flows slowly.  Support your breast with 4 fingers underneath and your thumb above your nipple. Make sure your fingers are well away from your nipple and your babys mouth.  Stroke your baby's lips gently with your finger or nipple.  When your baby's mouth is open wide enough, quickly bring your baby to your breast, placing your entire nipple and as much of the colored area around your nipple (areola) as possible into your baby's mouth.  More areola should be visible above your baby's upper lip than below the lower lip.  Your baby's tongue should be between his or her lower gum and your breast.  Ensure that your baby's mouth is correctly positioned around your nipple (latched). Your baby's lips should create a seal on your breast and be turned out (everted).  It is common for your baby to suck about 2-3 minutes in order to start the flow of breast milk. Latching  Teaching your baby how to latch on to your breast properly is very important. An improper latch can cause nipple pain and decreased milk supply for you and poor weight gain in your baby. Also, if your baby is not latched onto your nipple properly, he or she may swallow some air during feeding. This can make your baby fussy. Burping your baby when you switch breasts during the feeding can help to get rid of the air. However, teaching your baby to latch on properly is still the best way to prevent fussiness from swallowing air while breastfeeding. Signs that your baby has successfully latched on to your nipple:  Silent tugging or silent sucking, without causing you pain.  Swallowing heard between every 3-4  sucks.  Muscle movement above and in front of his or her ears while sucking. Signs that your baby has not successfully latched on to nipple:  Sucking sounds or smacking sounds from your baby while breastfeeding.  Nipple pain. If you think your baby has not latched on correctly, slip your finger into the corner of your babys mouth to break the suction and place it between your baby's gums. Attempt breastfeeding initiation again. Signs of Successful Breastfeeding  Signs from your baby:  A gradual decrease in the number of sucks or complete cessation of sucking.  Falling asleep.  Relaxation of his or her body.  Retention of a small amount of milk in his or her mouth.  Letting go of your breast by himself or herself. Signs from you:  Breasts that have increased in firmness, weight, and size 1-3 hours after feeding.  Breasts that are softer immediately after breastfeeding.  Increased milk volume, as well as a change in milk consistency and color by the fifth day of breastfeeding.  Nipples that are not sore, cracked, or bleeding. Signs That Your Pecola LeisureBaby is Getting Enough Milk  Wetting at least 1-2 diapers during the first 24 hours after birth.  Wetting at least 5-6 diapers every 24 hours for the first week after birth. The urine should be clear or pale yellow by 5 days after birth.  Wetting 6-8 diapers every 24 hours as your baby continues to grow and develop.  At least 3 stools in a 24-hour period by age 39 days. The stool should be soft and yellow.  At least 3 stools in a 24-hour period by age 16 days. The stool should be seedy  and yellow.  No loss of weight greater than 10% of birth weight during the first 46 days of age.  Average weight gain of 4-7 ounces (113-198 g) per week after age 64 days.  Consistent daily weight gain by age 68 days, without weight loss after the age of 2 weeks. After a feeding, your baby may spit up a small amount. This is common. Breastfeeding frequency  and duration Frequent feeding will help you make more milk and can prevent sore nipples and breast engorgement. Breastfeed when you feel the need to reduce the fullness of your breasts or when your baby shows signs of hunger. This is called "breastfeeding on demand." Avoid introducing a pacifier to your baby while you are working to establish breastfeeding (the first 4-6 weeks after your baby is born). After this time you may choose to use a pacifier. Research has shown that pacifier use during the first year of a baby's life decreases the risk of sudden infant death syndrome (SIDS). Allow your baby to feed on each breast as long as he or she wants. Breastfeed until your baby is finished feeding. When your baby unlatches or falls asleep while feeding from the first breast, offer the second breast. Because newborns are often sleepy in the first few weeks of life, you may need to awaken your baby to get him or her to feed. Breastfeeding times will vary from baby to baby. However, the following rules can serve as a guide to help you ensure that your baby is properly fed:  Newborns (babies 77 weeks of age or younger) may breastfeed every 1-3 hours.  Newborns should not go longer than 3 hours during the day or 5 hours during the night without breastfeeding.  You should breastfeed your baby a minimum of 8 times in a 24-hour period until you begin to introduce solid foods to your baby at around 30 months of age. Breast milk pumping Pumping and storing breast milk allows you to ensure that your baby is exclusively fed your breast milk, even at times when you are unable to breastfeed. This is especially important if you are going back to work while you are still breastfeeding or when you are not able to be present during feedings. Your lactation consultant can give you guidelines on how long it is safe to store breast milk. A breast pump is a machine that allows you to pump milk from your breast into a sterile  bottle. The pumped breast milk can then be stored in a refrigerator or freezer. Some breast pumps are operated by hand, while others use electricity. Ask your lactation consultant which type will work best for you. Breast pumps can be purchased, but some hospitals and breastfeeding support groups lease breast pumps on a monthly basis. A lactation consultant can teach you how to hand express breast milk, if you prefer not to use a pump. Caring for your breasts while you breastfeed Nipples can become dry, cracked, and sore while breastfeeding. The following recommendations can help keep your breasts moisturized and healthy:  Avoid using soap on your nipples.  Wear a supportive bra. Although not required, special nursing bras and tank tops are designed to allow access to your breasts for breastfeeding without taking off your entire bra or top. Avoid wearing underwire-style bras or extremely tight bras.  Air dry your nipples for 3-82minutes after each feeding.  Use only cotton bra pads to absorb leaked breast milk. Leaking of breast milk between feedings is normal.  Use lanolin on your nipples after breastfeeding. Lanolin helps to maintain your skin's normal moisture barrier. If you use pure lanolin, you do not need to wash it off before feeding your baby again. Pure lanolin is not toxic to your baby. You may also hand express a few drops of breast milk and gently massage that milk into your nipples and allow the milk to air dry. In the first few weeks after giving birth, some women experience extremely full breasts (engorgement). Engorgement can make your breasts feel heavy, warm, and tender to the touch. Engorgement peaks within 3-5 days after you give birth. The following recommendations can help ease engorgement:  Completely empty your breasts while breastfeeding or pumping. You may want to start by applying warm, moist heat (in the shower or with warm water-soaked hand towels) just before feeding or  pumping. This increases circulation and helps the milk flow. If your baby does not completely empty your breasts while breastfeeding, pump any extra milk after he or she is finished.  Wear a snug bra (nursing or regular) or tank top for 1-2 days to signal your body to slightly decrease milk production.  Apply ice packs to your breasts, unless this is too uncomfortable for you.  Make sure that your baby is latched on and positioned properly while breastfeeding. If engorgement persists after 48 hours of following these recommendations, contact your health care provider or a Advertising copywriterlactation consultant. Overall health care recommendations while breastfeeding  Eat healthy foods. Alternate between meals and snacks, eating 3 of each per day. Because what you eat affects your breast milk, some of the foods may make your baby more irritable than usual. Avoid eating these foods if you are sure that they are negatively affecting your baby.  Drink milk, fruit juice, and water to satisfy your thirst (about 10 glasses a day).  Rest often, relax, and continue to take your prenatal vitamins to prevent fatigue, stress, and anemia.  Continue breast self-awareness checks.  Avoid chewing and smoking tobacco. Chemicals from cigarettes that pass into breast milk and exposure to secondhand smoke may harm your baby.  Avoid alcohol and drug use, including marijuana. Some medicines that may be harmful to your baby can pass through breast milk. It is important to ask your health care provider before taking any medicine, including all over-the-counter and prescription medicine as well as vitamin and herbal supplements. It is possible to become pregnant while breastfeeding. If birth control is desired, ask your health care provider about options that will be safe for your baby. Contact a health care provider if:  You feel like you want to stop breastfeeding or have become frustrated with breastfeeding.  You have painful  breasts or nipples.  Your nipples are cracked or bleeding.  Your breasts are red, tender, or warm.  You have a swollen area on either breast.  You have a fever or chills.  You have nausea or vomiting.  You have drainage other than breast milk from your nipples.  Your breasts do not become full before feedings by the fifth day after you give birth.  You feel sad and depressed.  Your baby is too sleepy to eat well.  Your baby is having trouble sleeping.  Your baby is wetting less than 3 diapers in a 24-hour period.  Your baby has less than 3 stools in a 24-hour period.  Your baby's skin or the white part of his or her eyes becomes yellow.  Your baby is not gaining weight  by 6 days of age. Get help right away if:  Your baby is overly tired (lethargic) and does not want to wake up and feed.  Your baby develops an unexplained fever. This information is not intended to replace advice given to you by your health care provider. Make sure you discuss any questions you have with your health care provider. Document Released: 10/07/2005 Document Revised: 03/20/2016 Document Reviewed: 03/31/2013 Elsevier Interactive Patient Education  2017 Else[employer] Inc. Breast Pumping Tips If you are breastfeeding, there may be times when you cannot feed your baby directly. Returning to work or going on a trip are common examples. Pumping allows you to store breast milk and feed it to your baby later. You may not get much milk when you first start to pump. Your breasts should start to make more after a few days. If you pump at the times you usually feed your baby, you may be able to keep making enough milk to feed your baby without also using formula. The more often you pump, the more milk you will produce. When should I pump?  You can begin to pump soon after delivery. However, some experts recommend waiting about 4 weeks before giving your infant a bottle to make sure breastfeeding is going  well.  If you plan to return to work, begin pumping a few weeks before. This will help you develop techniques that work best for you. It also lets you build up a supply of breast milk.  When you are with your infant, feed on demand and pump after each feeding.  When you are away from your infant for several hours, pump for about 15 minutes every 2-3 hours. Pump both breasts at the same time if you can.  If your infant has a formula feeding, make sure to pump around the same time.  If you drink any alcohol, wait 2 hours before pumping. How do I prepare to pump? Your let-down reflexis the natural reaction to stimulation that makes your breast milk flow. It is easier to stimulate this reflex when you are relaxed. Find relaxation techniques that work for you. If you have difficulty with your let-down reflex, try these methods:  Smell one of your infant's blankets or an item of clothing.  Look at a picture or video of your infant.  Sit in a quiet, private space.  Massage the breast you plan to pump.  Place soothing warmth on the breast.  Play relaxing music. What are some general breast pumping tips?  Wash your hands before you pump. You do not need to wash your nipples or breasts.  There are three ways to pump.  You can use your hand to massage and compress your breast.  You can use a handheld manual pump.  You can use an electric pump.  Make sure the suction cup (flange) on the breast pump is the right size. Place the flange directly over the nipple. If it is the wrong size or placed the wrong way, it may be painful and cause nipple damage.  If pumping is uncomfortable, apply a small amount of purified or modified lanolin to your nipple and areola.  If you are using an electric pump, adjust the speed and suction power to be more comfortable.  If pumping is painful or if you are not getting very much milk, you may need a different type of pump. A lactation consultant can help  you determine what type of pump to use.  Keep a full water  bottle near you at all times. Drinking lots of fluid helps you make more milk.  You can store your milk to use later. Pumped breast milk can be stored in a sealable, sterile container or plastic bag. Label all stored breast milk with the date you pumped it.  Milk can stay out at room temperature for up to 8 hours.  You can store your milk in the refrigerator for up to 8 days.  You can store your milk in the freezer for 3 months. Thaw frozen milk using warm water. Do not put it in the microwave.  Do not smoke. Smoking can lower your milk supply and harm your infant. If you need help quitting, ask your health care provider to recommend a program. When should I call my health care provider or a lactation consultant?  You are having trouble pumping.  You are concerned that you are not making enough milk.  You have nipple pain, soreness, or redness.  You want to use birth control. Birth control pills may lower your milk supply. Talk to your health care provider about your options. This information is not intended to replace advice given to you by your health care provider. Make sure you discuss any questions you have with your health care provider. Document Released: 03/27/2010 Document Revised: 03/20/2016 Document Reviewed: 07/30/2013 Elsevier Interactive Patient Education  2017 [employer]

## 2016-10-20 ENCOUNTER — Encounter: Payer: Self-pay | Admitting: Emergency Medicine

## 2016-10-20 ENCOUNTER — Emergency Department
Admission: EM | Admit: 2016-10-20 | Discharge: 2016-10-20 | Disposition: A | Discharge status: Home / Self Care | Payer: 59 | Source: Home / Self Care | Attending: Family Medicine | Admitting: Family Medicine

## 2016-10-20 DIAGNOSIS — B309 Viral conjunctivitis, unspecified: Secondary | ICD-10-CM | POA: Diagnosis not present

## 2016-10-20 MED ORDER — OLOPATADINE HCL 0.1 % OP SOLN: 1.0000 [drp] | Freq: Two times a day (BID) | OPHTHALMIC | 0 refills | Status: DC

## 2016-10-20 NOTE — ED Triage Notes (Signed)
Yesterday noticed start of conjunctivitis of left eye; no other symptoms but does have 332 week old infant and concerned about transmission. She is breast feeding.

## 2016-10-20 NOTE — Discharge Instructions (Signed)
°  If your insurance does not cover the Patanol, you may try over the counter Visine, Refresh, or other over the counter antihistamine drops.    You you develop thick discharge from your eye, pain, or fever, please be re-evaluated.

## 2016-10-20 NOTE — ED Provider Notes (Signed)
CSN: [id number]     Arrival date & time 10/20/16  1355 History   First MD Initiated Contact with Patient 10/20/16 1427     Chief Complaint  Patient presents with  . Conjunctivitis   (Consider location/radiation/quality/duration/timing/severity/associated sxs/prior Treatment) HPI Crystal Sheppard is a 25 y.o. female presenting to UC with c/o sudden onset Left eye redness, itching, and small amount of crusty discharge since this morning.  She has had minimal nasal congestion, no other symptoms.  She notes she has a 232 week old infant and is concerned about spreading the infection.  She is currently breastfeeding.  Denies ear pain, sore throat, cough, or eye pain. No change in vision. She does not wear contacts.    Past Medical History:  Diagnosis Date  . Dysmetabolic syndrome X   . Hirsutism   . Migraines   . Other specified complication, antepartum(646.83)    headaches  . Polycystic ovarian disease   . Polycystic ovaries    History reviewed. No pertinent surgical history. Family History  Problem Relation Age of Onset  . Asthma Mother   . Hypertension Mother   . Hypertension Brother   . Asthma Brother   . Diabetes Paternal Grandmother   . Mental retardation Cousin     downs   Social History  Substance Use Topics  . Smoking status: Former Games developermoker  . Smokeless tobacco: Former NeurosurgeonUser  . Alcohol use Yes     Comment: occasionally   OB History    Gravida Para Term Preterm AB Living   3 2 2  0 1 2   SAB TAB Ectopic Multiple Live Births   0 1 0 0 2     Review of Systems  HENT: Positive for congestion (minimal) and rhinorrhea ( minimal). Negative for ear pain, postnasal drip, sinus pain, sinus pressure and sore throat.   Eyes: Positive for discharge, redness and itching. Negative for photophobia, pain and visual disturbance.  Respiratory: Negative for cough and shortness of breath.   Neurological: Negative for dizziness, light-headedness and headaches.    Allergies  Patient  has no known allergies.  Home Medications   Prior to Admission medications   Medication Sig Start Date End Date Taking? Authorizing Provider  acetaminophen (TYLENOL) 500 MG chewable tablet Chew 1,000 mg by mouth every 6 (six) hours as needed for pain.    Historical Provider, MD  ferrous sulfate (FERROUSUL) 325 (65 FE) MG tablet Take 1 tablet (325 mg total) by mouth daily with breakfast. 10/09/16   Raelyn Moraolitta Dawson, CNM  ibuprofen (ADVIL,MOTRIN) 600 MG tablet Take 1 tablet (600 mg total) by mouth every 6 (six) hours. 10/09/16   Raelyn Moraolitta Dawson, CNM  ipratropium (ATROVENT) 0.03 % nasal spray Place 2 sprays into both nostrils every 12 (twelve) hours. Patient not taking: Reported on 10/07/2016 08/28/16   Tyrone Nineyan B Grunz, MD  magnesium oxide (MAG-OX) 400 (241.3 Mg) MG tablet Take 1 tablet (400 mg total) by mouth daily. 10/09/16   Rolitta Arita Missawson, CNM  olopatadine (PATANOL) 0.1 % ophthalmic solution Place 1 drop into the left eye 2 (two) times daily. For at least 1 week 10/20/16   Junius FinnerErin O'Malley, PA-C  Prenatal Vit-Fe Fumarate-FA (PRENATAL MULTIVITAMIN) TABS tablet Take 1 tablet by mouth daily at 12 noon.    Historical Provider, MD   Meds Ordered and Administered this Visit  Medications - No data to display  BP 132/79 (BP Location: Left Arm)   Pulse 89   Temp 97.8 F (36.6 C) (Oral)   Resp  16   Ht 5\' 6"  (1.676 m)   Wt 229 lb (103.9 kg)   SpO2 98%   BMI 36.96 kg/m  No data found.   Physical Exam  Constitutional: She is oriented to person, place, and time. She appears well-developed and well-nourished.  HENT:  Head: Normocephalic and atraumatic.  Eyes: EOM and lids are normal. Pupils are equal, round, and reactive to light. Right eye exhibits no discharge. Left eye exhibits discharge ( scant, crusty). Left conjunctiva is injected ( minimally). Left conjunctiva has no hemorrhage. No scleral icterus.  Neck: Normal range of motion.  Cardiovascular: Normal rate.   Pulmonary/Chest: Effort normal.   Musculoskeletal: Normal range of motion.  Neurological: She is alert and oriented to person, place, and time.  Skin: Skin is warm and dry.  Psychiatric: She has a normal mood and affect. Her behavior is normal.  Nursing note and vitals reviewed.   Urgent Care Course   Clinical Course     Procedures (including critical care time)  Labs Review Labs Reviewed - No data to display  Imaging Review No results found.   MDM   1. Acute viral conjunctivitis of left eye    Exam c/w viral conjunctivitis Reassured pt.  Rx: patanol for symptomatic relief.  Encouraged f/u with PCP in 1 week if not improving, sooner if worsening.     Junius Finnerrin O'Malley, PA-C 10/20/16 [phone]

## 2016-11-14 ENCOUNTER — Telehealth (HOSPITAL_COMMUNITY): Payer: Self-pay | Admitting: Lactation Services

## 2016-11-14 NOTE — Telephone Encounter (Signed)
Lactation telephone call back:  Crystal Sheppard called with breast feeding questions for her 291 month old ton transition to a bottle in preparation to going back to work . Per mom have already tried the Medela nipple and baby gagged and really didn't like it.  LC explored options with mom-  1) change nipple to Dr. Manson PasseyBrown  medium based nipple . Explained Pace feeding.  And to have dad feed the bottle to the baby when mom is not present .  ( discussed it may be easier for baby if she doesn't hear the the moms voice)  2) if that option doesn't work to try feeding the baby on the 1st breast soften well, and instead of feeding the baby on the 2nd breast , have dad feed the baby a bottle starting at 1 1/2 oz with Pace feeding.  LC encouraged mom to call back with questions or concerns if the above options don't work.  LC also reviewed storage of breast milk.

## 2016-11-21 DIAGNOSIS — H5213 Myopia, bilateral: Secondary | ICD-10-CM | POA: Diagnosis not present

## 2016-12-05 MED FILL — NUVARING VAGINAL RING: 0.12-0.015 | 84 days supply | Qty: 3 | Fill #0

## 2017-01-14 ENCOUNTER — Ambulatory Visit (HOSPITAL_COMMUNITY)
Admission: EM | Admit: 2017-01-14 | Discharge: 2017-01-14 | Disposition: A | Discharge status: Home / Self Care | Payer: 59 | Attending: Family Medicine | Admitting: Family Medicine

## 2017-01-14 ENCOUNTER — Encounter (HOSPITAL_COMMUNITY): Payer: Self-pay | Admitting: Emergency Medicine

## 2017-01-14 DIAGNOSIS — J020 Streptococcal pharyngitis: Secondary | ICD-10-CM | POA: Diagnosis not present

## 2017-01-14 LAB — POCT RAPID STREP A: Streptococcus, Group A Screen (Direct): POSITIVE — AB

## 2017-01-14 MED ORDER — LIDOCAINE VISCOUS 2 % MT SOLN: 10.0000 mL | Freq: Three times a day (TID) | OROMUCOSAL | 0 refills | Status: DC | PRN

## 2017-01-14 MED ORDER — AMOXICILLIN 875 MG PO TABS: 875.0000 mg | ORAL_TABLET | Freq: Two times a day (BID) | ORAL | 0 refills | Status: DC

## 2017-01-14 NOTE — Discharge Instructions (Signed)
Tylenol/Motrin as needed for pain/fever °

## 2017-01-14 NOTE — ED Triage Notes (Signed)
Sore throat that started today. No fever.  

## 2017-01-14 NOTE — ED Provider Notes (Signed)
CSN: [id number]     Arrival date & time 01/14/17  1835 History   First MD Initiated Contact with Patient 01/14/17 1953     Chief Complaint  Patient presents with  . Sore Throat   (Consider location/radiation/quality/duration/timing/severity/associated sxs/prior Treatment) HPI: 26 y/o female presents with CC of sudden onset of sore throat since morning, gradually worsening. Pain to swallow. Denies difficulty swallowing. Denies fever/chills.  Past Medical History:  Diagnosis Date  . Dysmetabolic syndrome X   . Hirsutism   . Migraines   . Other specified complication, antepartum(646.83)    headaches  . Polycystic ovarian disease   . Polycystic ovaries    History reviewed. No pertinent surgical history. Family History  Problem Relation Age of Onset  . Asthma Mother   . Hypertension Mother   . Hypertension Brother   . Asthma Brother   . Diabetes Paternal Grandmother   . Mental retardation Cousin     downs   Social History  Substance Use Topics  . Smoking status: Former Games developer  . Smokeless tobacco: Former Neurosurgeon  . Alcohol use Yes     Comment: occasionally   OB History    Gravida Para Term Preterm AB Living   3 2 2  0 1 2   SAB TAB Ectopic Multiple Live Births   0 1 0 0 2     Review of Systems  Constitutional: Negative for chills and fever.  HENT: Positive for sore throat.   Gastrointestinal: Negative.   Genitourinary: Negative.   Neurological: Negative.     Allergies  Patient has no known allergies.  Home Medications   Prior to Admission medications   Medication Sig Start Date End Date Taking? Authorizing Provider  acetaminophen (TYLENOL) 500 MG chewable tablet Chew 1,000 mg by mouth every 6 (six) hours as needed for pain.    Historical Provider, MD  amoxicillin (AMOXIL) 875 MG tablet Take 1 tablet (875 mg total) by mouth 2 (two) times daily. 01/14/17   Caylen Yardley, NP  ferrous sulfate (FERROUSUL) 325 (65 FE) MG tablet Take 1 tablet (325 mg total) by mouth  daily with breakfast. 10/09/16   Raelyn Mora, CNM  ibuprofen (ADVIL,MOTRIN) 600 MG tablet Take 1 tablet (600 mg total) by mouth every 6 (six) hours. 10/09/16   Raelyn Mora, CNM  ipratropium (ATROVENT) 0.03 % nasal spray Place 2 sprays into both nostrils every 12 (twelve) hours. Patient not taking: Reported on 10/07/2016 08/28/16   Tyrone Nine, MD  lidocaine (XYLOCAINE) 2 % solution Use as directed 10 mLs in the mouth or throat every 8 (eight) hours as needed for mouth pain (Swish and swallow). 01/14/17   Brizza Nathanson, NP  magnesium oxide (MAG-OX) 400 (241.3 Mg) MG tablet Take 1 tablet (400 mg total) by mouth daily. 10/09/16   Rolitta Arita Miss, CNM  olopatadine (PATANOL) 0.1 % ophthalmic solution Place 1 drop into the left eye 2 (two) times daily. For at least 1 week 10/20/16   Junius Finner, PA-C  Prenatal Vit-Fe Fumarate-FA (PRENATAL MULTIVITAMIN) TABS tablet Take 1 tablet by mouth daily at 12 noon.    Historical Provider, MD   Meds Ordered and Administered this Visit  Medications - No data to display  BP (!) 143/79   Pulse 90   Temp 98.9 F (37.2 C) (Oral)   Resp 16   Ht 5\' 6"  (1.676 m)   Wt 240 lb (108.9 kg)   LMP 12/10/2016   SpO2 100%   BMI 38.74 kg/m  No data found.  Physical Exam  Constitutional: She appears well-developed and well-nourished. No distress.  HENT:  Head: Normocephalic.  Mouth/Throat: Uvula is midline. Posterior oropharyngeal edema and posterior oropharyngeal erythema present. No oropharyngeal exudate. Tonsils are 0 on the right. Tonsils are 0 on the left.  Eyes: Pupils are equal, round, and reactive to light.  Neck: Neck supple.  Cardiovascular: Normal rate, regular rhythm and normal heart sounds.   Pulmonary/Chest: Effort normal and breath sounds normal.    Urgent Care Course     Procedures (including critical care time)  Labs Review Labs Reviewed  POCT RAPID STREP A - Abnormal; Notable for the following:       Result Value    Streptococcus, Group A Screen (Direct) POSITIVE (*)    All other components within normal limits    Imaging Review No results found.   Visual Acuity Review  Right Eye Distance:   Left Eye Distance:   Bilateral Distance:    Right Eye Near:   Left Eye Near:    Bilateral Near:         MDM   1. Strep throat   Rapid Strep Positive. Tx with ABX. Viscous lidocaine for pain/anesthetic effect. Tylenol/Motrin as needed for pain [employer]/fever    Harrold Fitchett, NP 01/14/17 2030

## 2017-01-27 MED FILL — TOPIRAMATE 25 MG TABLET: 25 | 30 days supply | Qty: 120 | Fill #0

## 2017-01-30 MED FILL — BACLOFEN 10 MG TABLET: 10 | 20 days supply | Qty: 60 | Fill #0

## 2017-02-25 ENCOUNTER — Emergency Department (HOSPITAL_COMMUNITY)
Admission: EM | Admit: 2017-02-25 | Discharge: 2017-02-25 | Disposition: A | Discharge status: Home / Self Care | Payer: 59 | Attending: Emergency Medicine | Admitting: Emergency Medicine

## 2017-02-25 ENCOUNTER — Encounter (HOSPITAL_COMMUNITY): Payer: Self-pay

## 2017-02-25 DIAGNOSIS — Z79899 Other long term (current) drug therapy: Secondary | ICD-10-CM | POA: Diagnosis not present

## 2017-02-25 DIAGNOSIS — Z87891 Personal history of nicotine dependence: Secondary | ICD-10-CM | POA: Insufficient documentation

## 2017-02-25 DIAGNOSIS — R51 Headache: Secondary | ICD-10-CM | POA: Insufficient documentation

## 2017-02-25 DIAGNOSIS — R519 Headache, unspecified: Secondary | ICD-10-CM

## 2017-02-25 DIAGNOSIS — G43909 Migraine, unspecified, not intractable, without status migrainosus: Secondary | ICD-10-CM | POA: Diagnosis present

## 2017-02-25 MED ORDER — PROCHLORPERAZINE EDISYLATE 5 MG/ML IJ SOLN
10.0000 mg | Freq: Once | INTRAMUSCULAR | Status: AC
  Administered 2017-02-25: 10 mg via INTRAVENOUS
  Filled 2017-02-25: qty 2

## 2017-02-25 MED ORDER — DIPHENHYDRAMINE HCL 50 MG/ML IJ SOLN
25.0000 mg | Freq: Once | INTRAMUSCULAR | Status: AC
  Administered 2017-02-25: 25 mg via INTRAVENOUS
  Filled 2017-02-25: qty 1

## 2017-02-25 MED ORDER — SODIUM CHLORIDE 0.9 % IV BOLUS (SEPSIS)
1000.0000 mL | Freq: Once | INTRAVENOUS | Status: AC
  Administered 2017-02-25: 1000 mL via INTRAVENOUS

## 2017-02-25 MED ORDER — KETOROLAC TROMETHAMINE 15 MG/ML IJ SOLN
15.0000 mg | Freq: Once | INTRAMUSCULAR | Status: AC
  Administered 2017-02-25: 15 mg via INTRAVENOUS
  Filled 2017-02-25: qty 1

## 2017-02-25 NOTE — ED Triage Notes (Signed)
Pt states she has had migraine X1 week. Pt alert and oriented, hx of migraines and uses prescription meds at home without relief. Hypertensive in triage.

## 2017-02-25 NOTE — ED Provider Notes (Signed)
[facility]EMERGENCY DEPT Provider Note   CSN: [id number] Arrival date & time: 02/25/17  1643  By signing my name below, I, Crystal Sheppard, attest that this documentation has been prepared under the direction and in the presence of Crystal Razor, MD. Electronically Signed: Sonum Sheppard, Neurosurgeon. 02/25/17. 6:55 PM.  History   Chief Complaint Chief Complaint  Patient presents with  . Migraine   The history is provided by the patient. No language interpreter was used.    HPI Comments: Crystal Sheppard is a 26 y.o. female who presents to the Emergency Department complaining of gradual onset, persistent HA that began 1 week ago. She states initially it was a frontal HA but has now progressed to the entire head. She has associated photophobia, nausea, blurry vision, neck stiffness, subjective fever. She has a history of similar symptoms with prior migraines. She takes baclofen and Topamax without significant relief. She denies vomiting.    Past Medical History:  Diagnosis Date  . Dysmetabolic syndrome X   . Hirsutism   . Migraines   . Other specified complication, antepartum(646.83)    headaches  . Polycystic ovarian disease   . Polycystic ovaries     Patient Active Problem List   Diagnosis Date Noted  . Second-degree perineal laceration, with delivery 10/08/2016  . Postpartum care following vaginal delivery (12/18) 10/07/2016  . Anemia of mother in pregnancy, delivered 09/26/2012    History reviewed. No pertinent surgical history.  OB History    Gravida Para Term Preterm AB Living   3 2 2  0 1 2   SAB TAB Ectopic Multiple Live Births   0 1 0 0 2       Home Medications    Prior to Admission medications   Medication Sig Start Date End Date Taking? Authorizing Provider  acetaminophen (TYLENOL) 500 MG chewable tablet Chew 1,000 mg by mouth every 6 (six) hours as needed for pain.    [provider]  amoxicillin (AMOXIL) 875 MG tablet Take 1 tablet (875 mg total) by mouth 2  (two) times daily. 01/14/17   Multani, Bhupinder, NP  ferrous sulfate (FERROUSUL) 325 (65 FE) MG tablet Take 1 tablet (325 mg total) by mouth daily with breakfast. 10/09/16   Raelyn Mora, CNM  ibuprofen (ADVIL,MOTRIN) 600 MG tablet Take 1 tablet (600 mg total) by mouth every 6 (six) hours. 10/09/16   Raelyn Mora, CNM  ipratropium (ATROVENT) 0.03 % nasal spray Place 2 sprays into both nostrils every 12 (twelve) hours. Patient not taking: Reported on 10/07/2016 08/28/16   Tyrone Nine, MD  lidocaine (XYLOCAINE) 2 % solution Use as directed 10 mLs in the mouth or throat every 8 (eight) hours as needed for mouth pain (Swish and swallow). 01/14/17   Multani, Bhupinder, NP  magnesium oxide (MAG-OX) 400 (241.3 Mg) MG tablet Take 1 tablet (400 mg total) by mouth daily. 10/09/16   Raelyn Mora, CNM  olopatadine (PATANOL) 0.1 % ophthalmic solution Place 1 drop into the left eye 2 (two) times daily. For at least 1 week 10/20/16   Junius Finner, PA-C  Prenatal Vit-Fe Fumarate-FA (PRENATAL MULTIVITAMIN) TABS tablet Take 1 tablet by mouth daily at 12 noon.    [provider]    Family History Family History  Problem Relation Age of Onset  . Asthma Mother   . Hypertension Mother   . Hypertension Brother   . Asthma Brother   . Diabetes Paternal Grandmother   . Mental retardation Cousin     downs  Social History Social History  Substance Use Topics  . Smoking status: Former Games developermoker  . Smokeless tobacco: Former NeurosurgeonUser  . Alcohol use Yes     Comment: occasionally     Allergies   Patient has no known allergies.   Review of Systems Review of Systems   All other systems reviewed and are negative for acute change except as noted in the HPI.   Physical Exam Updated Vital Signs BP (!) 168/110 (BP Location: Left Arm)   Pulse 88   Temp 98.8 F (37.1 C) (Oral)   Resp 16   LMP 02/24/2017 (Exact Date)   SpO2 100%   Physical Exam  Constitutional: She is oriented to person,  place, and time. She appears well-developed and well-nourished.  HENT:  Head: Normocephalic and atraumatic.  Eyes: Conjunctivae and EOM are normal. Pupils are equal, round, and reactive to light.  Neck: Normal range of motion. Neck supple.  Cardiovascular: Normal rate, regular rhythm and normal heart sounds.   Pulmonary/Chest: Effort normal and breath sounds normal.  Abdominal: Soft.  Musculoskeletal: Normal range of motion.  Neurological: She is alert and oriented to person, place, and time. No sensory deficit. She exhibits normal muscle tone. Coordination normal.  5/5 strength in major muscle groups of  bilateral upper and lower extremities. Speech normal. No facial asymmetry. Normal finger to nose testing  Nursing note and vitals reviewed.    ED Treatments / Results  DIAGNOSTIC STUDIES: Oxygen Saturation is 100% on RA, nml by my interpretation.    COORDINATION OF CARE: 7:20 PM Discussed treatment plan with pt at bedside and pt agreed to plan.   Labs (all labs ordered are listed, but only abnormal results are displayed) Labs Reviewed - No data to display  EKG  EKG Interpretation None       Radiology No results found.  Procedures Procedures (including critical care time)  Medications Ordered in ED Medications - No data to display   Initial Impression / Assessment and Plan / ED Course  I have reviewed the triage vital signs and the nursing notes.  Pertinent labs & imaging results that were available during my care of the patient were reviewed by me and considered in my medical decision making (see chart for details).     26yF with HA. Suspect primary HA. Consider emergent secondary causes such as bleed, infectious or mass but doubt. There is no history of trauma. Pt has a nonfocal neurological exam. Afebrile and neck supple. No use of blood thinning medication. Consider ocular etiology such as acute angle closure glaucoma but doubt. Pt denies acute change in visual  acuity and eye exam unremarkable. Doubt temporal arteritis given age, no temporal tenderness and temporal artery pulsations palpable. Doubt CO poisoning. No contacts with similar symptoms. Doubt venous thrombosis. Doubt carotid or vertebral arteries dissection. Symptoms improved with meds. Feel that can be safely discharged, but strict return precautions discussed. Outpt fu.   Final Clinical Impressions(s) / ED Diagnoses   Final diagnoses:  Nonintractable headache, unspecified chronicity pattern, unspecified headache type    New Prescriptions New Prescriptions   No medications on file   I personally preformed the services scribed in my presence. The recorded information has been reviewed is accurate. Crystal RazorStephen Donterius Filley, MD.    Crystal RazorKohut, Zoila Ditullio, MD 03/03/17 [phone]

## 2017-03-03 MED FILL — NUVARING VAGINAL RING: 0.12-0.015 | 84 days supply | Qty: 3 | Fill #1 | Status: TO

## 2017-03-12 DIAGNOSIS — G43709 Chronic migraine without aura, not intractable, without status migrainosus: Secondary | ICD-10-CM | POA: Diagnosis not present

## 2017-03-12 DIAGNOSIS — G43829 Menstrual migraine, not intractable, without status migrainosus: Secondary | ICD-10-CM | POA: Diagnosis not present

## 2017-03-12 DIAGNOSIS — G43109 Migraine with aura, not intractable, without status migrainosus: Secondary | ICD-10-CM | POA: Diagnosis not present

## 2017-03-12 MED FILL — TOPIRAMATE 50 MG TABLET: 50 | 30 days supply | Qty: 120 | Fill #0

## 2017-03-12 MED FILL — CHLORZOXAZONE 500 MG TABLET: 500 | 20 days supply | Qty: 90 | Fill #0

## 2017-03-25 ENCOUNTER — Encounter: Payer: Self-pay | Admitting: *Deleted

## 2017-03-25 ENCOUNTER — Emergency Department
Admission: EM | Admit: 2017-03-25 | Discharge: 2017-03-25 | Disposition: A | Discharge status: Home / Self Care | Payer: 59 | Source: Home / Self Care | Attending: Family Medicine | Admitting: Family Medicine

## 2017-03-25 DIAGNOSIS — J020 Streptococcal pharyngitis: Secondary | ICD-10-CM | POA: Diagnosis not present

## 2017-03-25 LAB — POCT RAPID STREP A (OFFICE): Rapid Strep A Screen: POSITIVE — AB

## 2017-03-25 MED ORDER — IBUPROFEN 400 MG PO TABS
400.0000 mg | ORAL_TABLET | Freq: Once | ORAL | Status: AC
  Administered 2017-03-25: 400 mg via ORAL

## 2017-03-25 MED ORDER — PENICILLIN V POTASSIUM 500 MG PO TABS: ORAL_TABLET | ORAL | 0 refills | Status: DC

## 2017-03-25 NOTE — ED Triage Notes (Signed)
Pt c/o fever and sore throat x 1 day.

## 2017-03-25 NOTE — Discharge Instructions (Signed)
Try warm salt water gargles for sore throat.   May take Ibuprofen 200mg , 4 tabs every 8 hours with food, as needed for pain, fever, etc.  If increasing cold symptoms develop, try the following: Take plain guaifenesin (1200mg  extended release tabs such as Mucinex) twice daily, with plenty of water, for cough and congestion.  May add Pseudoephedrine (30mg , one or two every 4 to 6 hours) for sinus congestion.  Get adequate rest.   May use Afrin nasal spray (or generic oxymetazoline) each morning for about 5 days and then discontinue.  Also recommend using saline nasal spray several times daily and saline nasal irrigation (AYR is a common brand).  Use Flonase nasal spray each morning after using Afrin nasal spray and saline nasal irrigation. May take Delsym Cough Suppressant at bedtime for nighttime cough.  Stop all antihistamines for now, and other non-prescription cough/cold preparations.     Follow-up with family doctor if not improving about10 days.

## 2017-03-25 NOTE — ED Provider Notes (Signed)
[facility]    CSN: [id number] Arrival date & time: 03/25/17  1845     History   Chief Complaint Chief Complaint  Patient presents with  . Sore Throat  . Fever    HPI Crystal Sheppard is a 26 y.o. female.   Patient awoke yesterday with sore throat, myalgias, chills, and fever.  She has also had sinus congestion and mild cough.  Her ears feel full.   The history is provided by the patient.    Past Medical History:  Diagnosis Date  . Dysmetabolic syndrome X   . Hirsutism   . Migraines   . Other specified complication, antepartum(646.83)    headaches  . Polycystic ovarian disease   . Polycystic ovaries     Patient Active Problem List   Diagnosis Date Noted  . Second-degree perineal laceration, with delivery 10/08/2016  . Postpartum care following vaginal delivery (12/18) 10/07/2016  . Anemia of mother in pregnancy, delivered 09/26/2012    History reviewed. No pertinent surgical history.  OB History    Gravida Para Term Preterm AB Living   3 2 2  0 1 2   SAB TAB Ectopic Multiple Live Births   0 1 0 0 2       Home Medications    Prior to Admission medications   Medication Sig Start Date End Date Taking? Authorizing Provider  topiramate (TOPAMAX) 200 MG tablet Take 200 mg by mouth 2 (two) times daily.   Yes [provider]  acetaminophen (TYLENOL) 500 MG chewable tablet Chew 1,000 mg by mouth every 6 (six) hours as needed for pain.    [provider]  ferrous sulfate (FERROUSUL) 325 (65 FE) MG tablet Take 1 tablet (325 mg total) by mouth daily with breakfast. 10/09/16   Raelyn Mora, CNM  ibuprofen (ADVIL,MOTRIN) 600 MG tablet Take 1 tablet (600 mg total) by mouth every 6 (six) hours. 10/09/16   Raelyn Mora, CNM  ipratropium (ATROVENT) 0.03 % nasal spray Place 2 sprays into both nostrils every 12 (twelve) hours. Patient not taking: Reported on 10/07/2016 08/28/16   Tyrone Nine, MD  lidocaine (XYLOCAINE) 2 % solution Use as  directed 10 mLs in the mouth or throat every 8 (eight) hours as needed for mouth pain (Swish and swallow). 01/14/17   Multani, Bhupinder, NP  magnesium oxide (MAG-OX) 400 (241.3 Mg) MG tablet Take 1 tablet (400 mg total) by mouth daily. 10/09/16   Raelyn Mora, CNM  olopatadine (PATANOL) 0.1 % ophthalmic solution Place 1 drop into the left eye 2 (two) times daily. For at least 1 week 10/20/16   Junius Finner, PA-C  penicillin v potassium (VEETID) 500 MG tablet Take one tab by mouth twice daily for 10 days 03/25/17   Lattie Haw, MD  Prenatal Vit-Fe Fumarate-FA (PRENATAL MULTIVITAMIN) TABS tablet Take 1 tablet by mouth daily at 12 noon.    [provider]    Family History Family History  Problem Relation Age of Onset  . Asthma Mother   . Hypertension Mother   . Hypertension Brother   . Asthma Brother   . Diabetes Paternal Grandmother   . Mental retardation Cousin        downs    Social History Social History  Substance Use Topics  . Smoking status: Former Games developer  . Smokeless tobacco: Former Neurosurgeon  . Alcohol use Yes     Comment: occasionally     Allergies   Patient has no known allergies.  Review of Systems Review of Systems  + sore throat + cough No pleuritic pain No wheezing + nasal congestion + post-nasal drainage No sinus pain/pressure No itchy/red eyes ? earache No hemoptysis No SOB No fever, + chills No nausea No vomiting No abdominal pain No diarrhea No urinary symptoms No skin rash + fatigue + myalgias No headache Used OTC meds without relief    Physical Exam Triage Vital Signs ED Triage Vitals  Enc Vitals Group     BP 03/25/17 1903 125/83     Pulse Rate 03/25/17 1903 (!) 123     Resp 03/25/17 1903 18     Temp 03/25/17 1903 (!) 102.1 F (38.9 C)     Temp Source 03/25/17 1903 Oral     SpO2 03/25/17 1903 100 %     Weight 03/25/17 1904 227 lb (103 kg)     Height 03/25/17 1904 5\' 6"  (1.676 m)     Head Circumference --       Peak Flow --      Pain Score 03/25/17 1904 0     Pain Loc --      Pain Edu? --      Excl. in GC? --    No data found.   Updated Vital Signs BP 125/83 (BP Location: Left Arm)   Pulse (!) 123   Temp (!) 102.1 F (38.9 C) (Oral)   Resp 18   Ht 5\' 6"  (1.676 m)   Wt 227 lb (103 kg)   LMP 02/24/2017 (Exact Date)   SpO2 100%   BMI 36.64 kg/m   Visual Acuity Right Eye Distance:   Left Eye Distance:   Bilateral Distance:    Right Eye Near:   Left Eye Near:    Bilateral Near:     Physical Exam Nursing notes and Vital Signs reviewed. Appearance:  Patient appears stated age, and in no acute distress Eyes:  Pupils are equal, round, and reactive to light and accomodation.  Extraocular movement is intact.  Conjunctivae are not inflamed  Ears:  Canals normal.  Tympanic membranes normal.  Nose:  Congested turbinates.  No sinus tenderness.    Pharynx:  Bilateral erythema. Neck:  Supple.  Tender enlarged posterior/lateral nodes are palpated bilaterally.  Tonsillar nodes are tender to palpation and slightly enlarged bilaterally.  Lungs:  Clear to auscultation.  Breath sounds are equal.  Moving air well. Heart:  Regular rate and rhythm without murmurs, rubs, or gallops.  Abdomen:  Nontender without masses or hepatosplenomegaly.  Bowel sounds are present.  No CVA or flank tenderness.  Extremities:  No edema.  Skin:  No rash present.    UC Treatments / Results  Labs (all labs ordered are listed, but only abnormal results are displayed) Labs Reviewed  POCT RAPID STREP A (OFFICE) - Abnormal; Notable for the following:       Result Value   Rapid Strep A Screen Positive (*)    All other components within normal limits    EKG  EKG Interpretation None       Radiology No results found.  Procedures Procedures (including critical care time)  Medications Ordered in UC Medications - No data to display   Initial Impression / Assessment and Plan / UC Course  I have reviewed the  triage vital signs and the nursing notes.  Pertinent labs & imaging results that were available during my care of the patient were reviewed by me and considered in my medical decision making (see chart for  details).    ?viral URI superimposed on strep pharyngitis. Begin pen VK. Try warm salt water gargles for sore throat.   May take Ibuprofen 200mg , 4 tabs every 8 hours with food, as needed for pain, fever, etc.  If increasing cold symptoms develop, try the following: Take plain guaifenesin (1200mg  extended release tabs such as Mucinex) twice daily, with plenty of water, for cough and congestion.  May add Pseudoephedrine (30mg , one or two every 4 to 6 hours) for sinus congestion.  Get adequate rest.   May use Afrin nasal spray (or generic oxymetazoline) each morning for about 5 days and then discontinue.  Also recommend using saline nasal spray several times daily and saline nasal irrigation (AYR is a common brand).  Use Flonase nasal spray each morning after using Afrin nasal spray and saline nasal irrigation. May take Delsym Cough Suppressant at bedtime for nighttime cough.  Stop all antihistamines for now, and other non-prescription cough/cold preparations.     Follow-up with family doctor if not improving about10 days.    Final Clinical Impressions(s) / UC Diagnoses   Final diagnoses:  Strep pharyngitis    New Prescriptions New Prescriptions   PENICILLIN V POTASSIUM (VEETID) 500 MG TABLET    Take one tab by mouth twice daily for 10 days     Lattie HawBeese, Dayelin Balducci A, MD 03/28/17 1101

## 2017-04-25 MED FILL — TOPIRAMATE 50 MG TABLET: 50 | 30 days supply | Qty: 120 | Fill #1

## 2017-06-13 MED FILL — ATENOLOL 25 MG TABLET: 25 | 30 days supply | Qty: 30 | Fill #0

## 2017-07-14 MED FILL — lamoTRIgine 25 MG TABS: 25 | 30 days supply | Qty: 120 | Fill #0

## 2017-07-31 MED FILL — ATENOLOL 25 MG TABLET: 25 | 30 days supply | Qty: 30 | Fill #1

## 2017-09-10 MED FILL — ATENOLOL 25 MG TABLET: 25 | 30 days supply | Qty: 30 | Fill #2 | Status: TO

## 2017-12-04 ENCOUNTER — Ambulatory Visit (INDEPENDENT_AMBULATORY_CARE_PROVIDER_SITE_OTHER): Payer: BLUE CROSS/BLUE SHIELD | Admitting: Obstetrics & Gynecology

## 2017-12-04 ENCOUNTER — Encounter: Payer: Self-pay | Admitting: Obstetrics & Gynecology

## 2017-12-04 VITALS — BP 132/86 | Ht 66.0 in | Wt 210.0 lb

## 2017-12-04 DIAGNOSIS — Z01419 Encounter for gynecological examination (general) (routine) without abnormal findings: Secondary | ICD-10-CM | POA: Diagnosis not present

## 2017-12-04 DIAGNOSIS — Z30011 Encounter for initial prescription of contraceptive pills: Secondary | ICD-10-CM

## 2017-12-04 MED ORDER — NORETHINDRONE-ETH ESTRADIOL 1-35 MG-MCG PO TABS: 1.0000 | ORAL_TABLET | Freq: Every day | ORAL | 4 refills | Status: DC

## 2017-12-04 NOTE — Addendum Note (Signed)
Addended by: Berna SpareASTILLO, Phallon Haydu A on: 12/04/2017 04:54 PM   Modules accepted: Orders

## 2017-12-04 NOTE — Progress Notes (Signed)
Crystal Sheppard 1991/10/11 [id number]   History:    27 y.o. G3P2A1L2 Married.  Girls 1 and 29 yo, both doing very well  RP:  Established patient presenting for annual gyn exam   HPI: Well on Nuva ring for contraception, but taking 300 mg of Topamax every day for migraines.  No breakthrough bleeding.  No pelvic pain.  Normal vaginal secretions.  No pain with intercourse.  Urine and bowel movements normal.  Breasts normal.  BMI 33.89  Past medical history,surgical history, family history and social history were all reviewed and documented in the EPIC chart.  Gynecologic History Patient's last menstrual period was 12/03/2017. Contraception: NuvaRing vaginal inserts Last Pap: Normal per patient at last visit, will obtain records from [facility] Last mammogram: Never Bone Density: Never Colonoscopy: Never  Obstetric History OB History  Gravida Para Term Preterm AB Living  3 2 2  0 1 2  SAB TAB Ectopic Multiple Live Births  0 1 0 0 2    # Outcome Date GA Lbr Len/2nd Weight Sex Delivery Anes PTL Lv  3 Term 10/07/16 [redacted]w[redacted]d 02:15 / 00:21 7 lb 9.2 oz (3.436 kg) F Vag-Spont EPI, Local  LIV  2 Term 09/25/12 [redacted]w[redacted]d 05:12 / 00:14 7 lb 6.3 oz (3.354 kg) F Vag-Spont None  LIV     Birth Comments: WNL  1 TAB                ROS: A ROS was performed and pertinent positives and negatives are included in the history.  GENERAL: No fevers or chills. HEENT: No change in vision, no earache, sore throat or sinus congestion. NECK: No pain or stiffness. CARDIOVASCULAR: No chest pain or pressure. No palpitations. PULMONARY: No shortness of breath, cough or wheeze. GASTROINTESTINAL: No abdominal pain, nausea, vomiting or diarrhea, melena or bright red blood per rectum. GENITOURINARY: No urinary frequency, urgency, hesitancy or dysuria. MUSCULOSKELETAL: No joint or muscle pain, no back pain, no recent trauma. DERMATOLOGIC: No rash, no itching, no lesions. ENDOCRINE: No polyuria, polydipsia, no heat or cold  intolerance. No recent change in weight. HEMATOLOGICAL: No anemia or easy bruising or bleeding. NEUROLOGIC: No headache, seizures, numbness, tingling or weakness. PSYCHIATRIC: No depression, no loss of interest in normal activity or change in sleep pattern.     Exam:   BP 132/86   Ht 5\' 6"  (1.676 m)   Wt 210 lb (95.3 kg)   LMP 12/03/2017   BMI 33.89 kg/m   Body mass index is 33.89 kg/m.  General appearance : Well developed well nourished female. No acute distress HEENT: Eyes: no retinal hemorrhage or exudates,  Neck supple, trachea midline, no carotid bruits, no thyroidmegaly Lungs: Clear to auscultation, no rhonchi or wheezes, or rib retractions  Heart: Regular rate and rhythm, no murmurs or gallops Breast:Examined in sitting and supine position were symmetrical in appearance, no palpable masses or tenderness,  no skin retraction, no nipple inversion, no nipple discharge, no skin discoloration, no axillary or supraclavicular lymphadenopathy Abdomen: no palpable masses or tenderness, no rebound or guarding Extremities: no edema or skin discoloration or tenderness  Pelvic: Vulva: Normal             Vagina: No gross lesions or discharge  Cervix: No gross lesions or discharge.  Pap reflex done  Uterus  AV, normal size, shape and consistency, non-tender and mobile  Adnexa  Without masses or tenderness  Anus: Normal   Assessment/Plan:  27 y.o. female for annual exam   1.  Encounter for routine gynecological examination with Papanicolaou smear of cervix Normal gynecologic exam.  Pap reflex done.  Breast exam normal.  Body mass index at 33.89.  Encouraged to exercise regularly and recommend low calorie/low carb diet such as [employer] diet.  2. Encounter for initial prescription of contraceptive pills Given that patient is on Topamax 300 mg daily for migraines, needs a contraceptives with at least 35 mcg of estradiol.  Therefore switched from NuvaRing to Ortho-Novum generic 1/35.   Usage, risks and benefits reviewed.  Prescription sent to pharmacy.  Other orders - norethindrone-ethinyl estradiol 1/35 (ORTHO-NOVUM 1/35, 28,) tablet; Take 1 tablet by mouth daily.  Genia DelMarie-Lyne Nomar Broad MD, 4:21 PM 12/04/2017

## 2017-12-04 NOTE — Patient Instructions (Signed)
1. Encounter for routine gynecological examination with Papanicolaou smear of cervix Normal gynecologic exam.  Pap reflex done.  Breast exam normal.  Body mass index at 33.89.  Encouraged to exercise regularly and recommend low calorie/low carb diet such as [employer].  2. Encounter for initial prescription of contraceptive pills Given that patient is on Topamax 300 mg daily for migraines, needs a contraceptives with at least 35 mcg of estradiol.  Therefore switched from NuvaRing to Ortho-Novum generic 1/35.  Usage, risks and benefits reviewed.  Prescription sent to pharmacy.  Crystal Sheppard, it was a pleasure seeing you today!  I will inform you of your results as soon as they are available.   Oral Contraception Information Oral contraceptive pills (OCPs) are medicines taken to prevent pregnancy. OCPs work by preventing the ovaries from releasing eggs. The hormones in OCPs also cause the cervical mucus to thicken, preventing the sperm from entering the uterus. The hormones also cause the uterine lining to become thin, not allowing a fertilized egg to attach to the inside of the uterus. OCPs are highly effective when taken exactly as prescribed. However, OCPs do not prevent sexually transmitted diseases (STDs). Safe sex practices, such as using condoms along with the pill, can help prevent STDs. Before taking the pill, you may have a physical exam and Pap test. Your health care provider may order blood tests. The health care provider will make sure you are a good candidate for oral contraception. Discuss with your health care provider the possible side effects of the OCP you may be prescribed. When starting an OCP, it can take 2 to 3 months for the body to adjust to the changes in hormone levels in your body. Types of oral contraception  The combination pill-This pill contains estrogen and progestin (synthetic progesterone) hormones. The combination pill comes in 21-day, 28-day, or 91-day packs. Some  types of combination pills are meant to be taken continuously (365-day pills). With 21-day packs, you do not take pills for 7 days after the last pill. With 28-day packs, the pill is taken every day. The last 7 pills are without hormones. Certain types of pills have more than 21 hormone-containing pills. With 91-day packs, the first 84 pills contain both hormones, and the last 7 pills contain no hormones or contain estrogen only.  The minipill-This pill contains the progesterone hormone only. The pill is taken every day continuously. It is very important to take the pill at the same time each day. The minipill comes in packs of 28 pills. All 28 pills contain the hormone. Advantages of oral contraceptive pills  Decreases premenstrual symptoms.  Treats menstrual period cramps.  Regulates the menstrual cycle.  Decreases a heavy menstrual flow.  May treatacne, depending on the type of pill.  Treats abnormal uterine bleeding.  Treats polycystic ovarian syndrome.  Treats endometriosis.  Can be used as emergency contraception. Things that can make oral contraceptive pills less effective OCPs can be less effective if:  You forget to take the pill at the same time every day.  You have a stomach or intestinal disease that lessens the absorption of the pill.  You take OCPs with other medicines that make OCPs less effective, such as antibiotics, certain HIV medicines, and some seizure medicines.  You take expired OCPs.  You forget to restart the pill on day 7, when using the packs of 21 pills.  Risks associated with oral contraceptive pills Oral contraceptive pills can sometimes cause side effects, such as:  Headache.  Nausea.  Breast tenderness.  Irregular bleeding or spotting.  Combination pills are also associated with a small increased risk of:  Blood clots.  Heart attack.  Stroke.  This information is not intended to replace advice given to you by your health care  provider. Make sure you discuss any questions you have with your health care provider. Document Released: 12/28/2002 Document Revised: 03/14/2016 Document Reviewed: 03/28/2013 Elsevier Interactive Patient Education  2018 [place] Maintenance, Female Adopting a healthy lifestyle and getting preventive care can go a long way to promote health and wellness. Talk with your health care provider about what schedule of regular examinations is right for you. This is a good chance for you to check in with your provider about disease prevention and staying healthy. In between checkups, there are plenty of things you can do on your own. Experts have done a lot of research about which lifestyle changes and preventive measures are most likely to keep you healthy. Ask your health care provider for more information. Weight and diet Eat a healthy diet  Be sure to include plenty of vegetables, fruits, low-fat dairy products, and lean protein.  Do not eat a lot of foods high in solid fats, added sugars, or salt.  Get regular exercise. This is one of the most important things you can do for your health. ? Most adults should exercise for at least 150 minutes each week. The exercise should increase your heart rate and make you sweat (moderate-intensity exercise). ? Most adults should also do strengthening exercises at least twice a week. This is in addition to the moderate-intensity exercise.  Maintain a healthy weight  Body mass index (BMI) is a measurement that can be used to identify possible weight problems. It estimates body fat based on height and weight. Your health care provider can help determine your BMI and help you achieve or maintain a healthy weight.  For females 28 years of age and older: ? A BMI below 18.5 is considered underweight. ? A BMI of 18.5 to 24.9 is normal. ? A BMI of 25 to 29.9 is considered overweight. ? A BMI of 30 and above is considered obese.  Watch levels of  cholesterol and blood lipids  You should start having your blood tested for lipids and cholesterol at 29 years of age, then have this test every 5 years.  You may need to have your cholesterol levels checked more often if: ? Your lipid or cholesterol levels are high. ? You are older than 29 years of age. ? You are at high risk for heart disease.  Cancer screening Lung Cancer  Lung cancer screening is recommended for adults 43-44 years old who are at high risk for lung cancer because of a history of smoking.  A yearly low-dose CT scan of the lungs is recommended for people who: ? Currently smoke. ? Have quit within the past 15 years. ? Have at least a 30-pack-year history of smoking. A pack year is smoking an average of one pack of cigarettes a day for 1 year.  Yearly screening should continue until it has been 15 years since you quit.  Yearly screening should stop if you develop a health problem that would prevent you from having lung cancer treatment.  Breast Cancer  Practice breast self-awareness. This means understanding how your breasts normally appear and feel.  It also means doing regular breast self-exams. Let your health care provider know about any changes, no matter how small.  If  you are in your 20s or 30s, you should have a clinical breast exam (CBE) by a health care provider every 1-3 years as part of a regular health exam.  If you are 12 or older, have a CBE every year. Also consider having a breast X-ray (mammogram) every year.  If you have a family history of breast cancer, talk to your health care provider about genetic screening.  If you are at high risk for breast cancer, talk to your health care provider about having an MRI and a mammogram every year.  Breast cancer gene (BRCA) assessment is recommended for women who have family members with BRCA-related cancers. BRCA-related cancers include: ? Breast. ? Ovarian. ? Tubal. ? Peritoneal cancers.  Results  of the assessment will determine the need for genetic counseling and BRCA1 and BRCA2 testing.  Cervical Cancer Your health care provider may recommend that you be screened regularly for cancer of the pelvic organs (ovaries, uterus, and vagina). This screening involves a pelvic examination, including checking for microscopic changes to the surface of your cervix (Pap test). You may be encouraged to have this screening done every 3 years, beginning at age 65.  For women ages 41-65, health care providers may recommend pelvic exams and Pap testing every 3 years, or they may recommend the Pap and pelvic exam, combined with testing for human papilloma virus (HPV), every 5 years. Some types of HPV increase your risk of cervical cancer. Testing for HPV may also be done on women of any age with unclear Pap test results.  Other health care providers may not recommend any screening for nonpregnant women who are considered low risk for pelvic cancer and who do not have symptoms. Ask your health care provider if a screening pelvic exam is right for you.  If you have had past treatment for cervical cancer or a condition that could lead to cancer, you need Pap tests and screening for cancer for at least 20 years after your treatment. If Pap tests have been discontinued, your risk factors (such as having a new sexual partner) need to be reassessed to determine if screening should resume. Some women have medical problems that increase the chance of getting cervical cancer. In these cases, your health care provider may recommend more frequent screening and Pap tests.  Colorectal Cancer  This type of cancer can be detected and often prevented.  Routine colorectal cancer screening usually begins at 29 years of age and continues through 29 years of age.  Your health care provider may recommend screening at an earlier age if you have risk factors for colon cancer.  Your health care provider may also recommend using  home test kits to check for hidden blood in the stool.  A small camera at the end of a tube can be used to examine your colon directly (sigmoidoscopy or colonoscopy). This is done to check for the earliest forms of colorectal cancer.  Routine screening usually begins at age 5.  Direct examination of the colon should be repeated every 5-10 years through 29 years of age. However, you may need to be screened more often if early forms of precancerous polyps or small growths are found.  Skin Cancer  Check your skin from head to toe regularly.  Tell your health care provider about any new moles or changes in moles, especially if there is a change in a mole's shape or color.  Also tell your health care provider if you have a mole that is larger  than the size of a pencil eraser.  Always use sunscreen. Apply sunscreen liberally and repeatedly throughout the day.  Protect yourself by wearing long sleeves, pants, a wide-brimmed hat, and sunglasses whenever you are outside.  Heart disease, diabetes, and high blood pressure  High blood pressure causes heart disease and increases the risk of stroke. High blood pressure is more likely to develop in: ? People who have blood pressure in the high end of the normal range (130-139/85-89 mm Hg). ? People who are overweight or obese. ? People who are African American.  If you are 68-1 years of age, have your blood pressure checked every 3-5 years. If you are 67 years of age or older, have your blood pressure checked every year. You should have your blood pressure measured twice-once when you are at a hospital or clinic, and once when you are not at a hospital or clinic. Record the average of the two measurements. To check your blood pressure when you are not at a hospital or clinic, you can use: ? An automated blood pressure machine at a pharmacy. ? A home blood pressure monitor.  If you are between 72 years and 23 years old, ask your health care provider  if you should take aspirin to prevent strokes.  Have regular diabetes screenings. This involves taking a blood sample to check your fasting blood sugar level. ? If you are at a normal weight and have a low risk for diabetes, have this test once every three years after 29 years of age. ? If you are overweight and have a high risk for diabetes, consider being tested at a younger age or more often. Preventing infection Hepatitis B  If you have a higher risk for hepatitis B, you should be screened for this virus. You are considered at high risk for hepatitis B if: ? You were born in a country where hepatitis B is common. Ask your health care provider which countries are considered high risk. ? Your parents were born in a high-risk country, and you have not been immunized against hepatitis B (hepatitis B vaccine). ? You have HIV or AIDS. ? You use needles to inject street drugs. ? You live with someone who has hepatitis B. ? You have had sex with someone who has hepatitis B. ? You get hemodialysis treatment. ? You take certain medicines for conditions, including cancer, organ transplantation, and autoimmune conditions.  Hepatitis C  Blood testing is recommended for: ? Everyone born from [birth year] through 1967. ? Anyone with known risk factors for hepatitis C.  Sexually transmitted infections (STIs)  You should be screened for sexually transmitted infections (STIs) including gonorrhea and chlamydia if: ? You are sexually active and are younger than 29 years of age. ? You are older than 29 years of age and your health care provider tells you that you are at risk for this type of infection. ? Your sexual activity has changed since you were last screened and you are at an increased risk for chlamydia or gonorrhea. Ask your health care provider if you are at risk.  If you do not have HIV, but are at risk, it may be recommended that you take a prescription medicine daily to prevent HIV infection. This  is called pre-exposure prophylaxis (PrEP). You are considered at risk if: ? You are sexually active and do not regularly use condoms or know the HIV status of your partner(s). ? You take drugs by injection. ? You are sexually active with  a partner who has HIV.  Talk with your health care provider about whether you are at high risk of being infected with HIV. If you choose to begin PrEP, you should first be tested for HIV. You should then be tested every 3 months for as long as you are taking PrEP. Pregnancy  If you are premenopausal and you may become pregnant, ask your health care provider about preconception counseling.  If you may become pregnant, take 400 to 800 micrograms (mcg) of folic acid every day.  If you want to prevent pregnancy, talk to your health care provider about birth control (contraception). Osteoporosis and menopause  Osteoporosis is a disease in which the bones lose minerals and strength with aging. This can result in serious bone fractures. Your risk for osteoporosis can be identified using a bone density scan.  If you are 38 years of age or older, or if you are at risk for osteoporosis and fractures, ask your health care provider if you should be screened.  Ask your health care provider whether you should take a calcium or vitamin D supplement to lower your risk for osteoporosis.  Menopause may have certain physical symptoms and risks.  Hormone replacement therapy may reduce some of these symptoms and risks. Talk to your health care provider about whether hormone replacement therapy is right for you. Follow these instructions at home:  Schedule regular health, dental, and eye exams.  Stay current with your immunizations.  Do not use any tobacco products including cigarettes, chewing tobacco, or electronic cigarettes.  If you are pregnant, do not drink alcohol.  If you are breastfeeding, limit how much and how often you drink alcohol.  Limit alcohol intake  to no more than 1 drink per day for nonpregnant women. One drink equals 12 ounces of beer, 5 ounces of wine, or 1 ounces of hard liquor.  Do not use street drugs.  Do not share needles.  Ask your health care provider for help if you need support or information about quitting drugs.  Tell your health care provider if you often feel depressed.  Tell your health care provider if you have ever been abused or do not feel safe at home. This information is not intended to replace advice given to you by your health care provider. Make sure you discuss any questions you have with your health care provider. Document Released: 04/22/2011 Document Revised: 03/14/2016 Document Reviewed: 07/11/2015 Elsevier Interactive Patient Education  [employer].

## 2017-12-10 LAB — PAP IG W/ RFLX HPV ASCU

## 2017-12-10 LAB — HUMAN PAPILLOMAVIRUS, HIGH RISK: HPV DNA High Risk: NOT DETECTED

## 2017-12-22 ENCOUNTER — Other Ambulatory Visit: Payer: Self-pay

## 2017-12-22 ENCOUNTER — Encounter (HOSPITAL_COMMUNITY): Payer: Self-pay

## 2017-12-22 ENCOUNTER — Emergency Department (HOSPITAL_COMMUNITY)
Admission: EM | Admit: 2017-12-22 | Discharge: 2017-12-22 | Disposition: A | Discharge status: Home / Self Care | Payer: BLUE CROSS/BLUE SHIELD | Attending: Emergency Medicine | Admitting: Emergency Medicine

## 2017-12-22 DIAGNOSIS — R030 Elevated blood-pressure reading, without diagnosis of hypertension: Secondary | ICD-10-CM | POA: Insufficient documentation

## 2017-12-22 DIAGNOSIS — Z87891 Personal history of nicotine dependence: Secondary | ICD-10-CM | POA: Diagnosis not present

## 2017-12-22 DIAGNOSIS — H538 Other visual disturbances: Secondary | ICD-10-CM | POA: Insufficient documentation

## 2017-12-22 DIAGNOSIS — R519 Headache, unspecified: Secondary | ICD-10-CM

## 2017-12-22 DIAGNOSIS — Z79899 Other long term (current) drug therapy: Secondary | ICD-10-CM | POA: Diagnosis not present

## 2017-12-22 DIAGNOSIS — R51 Headache: Secondary | ICD-10-CM | POA: Insufficient documentation

## 2017-12-22 DIAGNOSIS — I159 Secondary hypertension, unspecified: Secondary | ICD-10-CM

## 2017-12-22 LAB — CBC WITH DIFFERENTIAL/PLATELET
Basophils Absolute: 0 10*3/uL (ref 0.0–0.1)
Basophils Relative: 0 %
Eosinophils Absolute: 0.1 10*3/uL (ref 0.0–0.7)
Eosinophils Relative: 1 %
HEMATOCRIT: 42.5 % (ref 36.0–46.0)
Hemoglobin: 13.5 g/dL (ref 12.0–15.0)
LYMPHS PCT: 47 %
Lymphs Abs: 2.4 10*3/uL (ref 0.7–4.0)
MCH: 28 pg (ref 26.0–34.0)
MCHC: 31.8 g/dL (ref 30.0–36.0)
MCV: 88.2 fL (ref 78.0–100.0)
MONOS PCT: 8 %
Monocytes Absolute: 0.4 10*3/uL (ref 0.1–1.0)
NEUTROS ABS: 2.3 10*3/uL (ref 1.7–7.7)
Neutrophils Relative %: 44 %
Platelets: 281 10*3/uL (ref 150–400)
RBC: 4.82 MIL/uL (ref 3.87–5.11)
RDW: 13.1 % (ref 11.5–15.5)
WBC: 5.1 10*3/uL (ref 4.0–10.5)

## 2017-12-22 LAB — I-STAT CHEM 8, ED
BUN: 7 mg/dL (ref 6–20)
CALCIUM ION: 1.18 mmol/L (ref 1.15–1.40)
CREATININE: 0.6 mg/dL (ref 0.44–1.00)
Chloride: 108 mmol/L (ref 101–111)
GLUCOSE: 85 mg/dL (ref 65–99)
HCT: 42 % (ref 36.0–46.0)
HEMOGLOBIN: 14.3 g/dL (ref 12.0–15.0)
Potassium: 3.9 mmol/L (ref 3.5–5.1)
Sodium: 140 mmol/L (ref 135–145)
TCO2: 20 mmol/L — AB (ref 22–32)

## 2017-12-22 LAB — I-STAT BETA HCG BLOOD, ED (MC, WL, AP ONLY): I-stat hCG, quantitative: <5 m[IU]/mL (ref <5)

## 2017-12-22 NOTE — ED Provider Notes (Signed)
[facility] Provider Note   CSN: [id number] Arrival date & time: 12/22/17  1232     History   Chief Complaint Chief Complaint  Patient presents with  . Hypertension    HPI Jeb LeveringBrittani B Odekirk is a 27 y.o. female.  HPI Jeb LeveringBrittani B Sconyers is a 27 y.o. female with hx of polycystic ovarian disease, migraine headaches, presents to emergency department complaining of dizziness, blurred vision, elevated blood pressure.  Patient states she was at work when she began not feeling well today.  She states that she has had blurred vision all day today.  She has a mild headache which is not unusual for her.  She states she does get blurred vision sometimes with her migraines.  She takes Topamax and Robaxin daily for her migraine headaches, states she took her dose today.  Because she was not feeling well at work, and she works at a surgical center, they checked her blood pressure and it was 170 systolic over 115 diastolic.  Patient does not have a history of hypertension.  She states she was sent here for evaluation.  She states that she recently had an OB/GYN visit, just 2 weeks ago, and states her blood pressure was normal then.  She does admit to eating salty meal last night, otherwise denies any new stressors or medications changes.  She denies any numbness or weakness in extremities.  She denies any other complaints at this time.   Past Medical History:  Diagnosis Date  . Dysmetabolic syndrome X   . Hirsutism   . Migraines   . Other specified complication, antepartum(646.83)    headaches  . Polycystic ovarian disease   . Polycystic ovaries     Patient Active Problem List   Diagnosis Date Noted  . Second-degree perineal laceration, with delivery 10/08/2016  . Postpartum care following vaginal delivery (12/18) 10/07/2016  . Anemia of mother in pregnancy, delivered 09/26/2012    History reviewed. No pertinent surgical history.  OB History    Gravida  Para Term Preterm AB Living   3 2 2  0 1 2   SAB TAB Ectopic Multiple Live Births   0 1 0 0 2       Home Medications    Prior to Admission medications   Medication Sig Start Date End Date Taking? Authorizing Provider  acetaminophen (TYLENOL) 500 MG chewable tablet Chew 1,000 mg by mouth every 6 (six) hours as needed for pain.    [provider]  magnesium oxide (MAG-OX) 400 (241.3 Mg) MG tablet Take 1 tablet (400 mg total) by mouth daily. 10/09/16   Raelyn Moraawson, Rolitta, CNM  norethindrone-ethinyl estradiol 1/35 (ORTHO-NOVUM 1/35, 28,) tablet Take 1 tablet by mouth daily. 12/04/17   Genia DelLavoie, Marie-Lyne, MD  topiramate (TOPAMAX) 200 MG tablet Take 200 mg by mouth 2 (two) times daily.    [provider]    Family History Family History  Problem Relation Age of Onset  . Asthma Mother   . Hypertension Mother   . Hypertension Brother   . Asthma Brother   . Diabetes Paternal Grandmother   . Mental retardation Cousin        downs    Social History Social History   Tobacco Use  . Smoking status: Former Games developermoker  . Smokeless tobacco: Former Engineer, waterUser  Substance Use Topics  . Alcohol use: Yes    Comment: occasionally  . Drug use: No     Allergies   Patient has no known allergies.  Review of Systems Review of Systems  Constitutional: Negative for chills and fever.  Eyes: Positive for visual disturbance.  Respiratory: Negative for cough, chest tightness and shortness of breath.   Cardiovascular: Negative for chest pain, palpitations and leg swelling.  Gastrointestinal: Negative for abdominal pain, diarrhea, nausea and vomiting.  Genitourinary: Negative for dysuria, flank pain, pelvic pain, vaginal bleeding, vaginal discharge and vaginal pain.  Musculoskeletal: Negative for arthralgias, myalgias, neck pain and neck stiffness.  Skin: Negative for rash.  Neurological: Positive for dizziness, light-headedness and headaches. Negative for weakness.  All other systems  reviewed and are negative.    Physical Exam Updated Vital Signs BP (!) 143/94 (BP Location: Right Arm)   Pulse 79   Temp 98 F (36.7 C) (Oral)   Resp 16   Ht 5\' 6"  (1.676 m)   Wt 95.3 kg (210 lb)   LMP 12/03/2017 (Exact Date)   SpO2 100%   Breastfeeding? No   BMI 33.89 kg/m   Physical Exam  Constitutional: She is oriented to person, place, and time. She appears well-developed and well-nourished. No distress.  HENT:  Head: Normocephalic.  Eyes: Conjunctivae are normal.  Neck: Neck supple.  Cardiovascular: Normal rate, regular rhythm and normal heart sounds.  Pulmonary/Chest: Effort normal and breath sounds normal. No respiratory distress. She has no wheezes. She has no rales.  Abdominal: Soft. Bowel sounds are normal. She exhibits no distension. There is no tenderness. There is no rebound.  Musculoskeletal: She exhibits no edema.  Neurological: She is alert and oriented to person, place, and time.  5/5 and equal upper and lower extremity strength bilaterally. Equal grip strength bilaterally. Normal finger to nose and heel to shin. No pronator drift. Gait is normal.  Skin: Skin is warm and dry.  Psychiatric: She has a normal mood and affect. Her behavior is normal.  Nursing note and vitals reviewed.    ED Treatments / Results  Labs (all labs ordered are listed, but only abnormal results are displayed) Labs Reviewed  I-STAT CHEM 8, ED - Abnormal; Notable for the following components:      Result Value   TCO2 20 (*)    All other components within normal limits  CBC WITH DIFFERENTIAL/PLATELET  I-STAT BETA HCG BLOOD, ED ([facility], [facility], [facility] ONLY)    EKG  EKG Interpretation None       Radiology No results found.  Procedures Procedures (including critical care time)  Medications Ordered in ED Medications - No data to display   Initial Impression / Assessment and Plan / ED Course  I have reviewed the triage vital signs and the nursing notes.  Pertinent labs &  imaging results that were available during my care of the patient were reviewed by me and considered in my medical decision making (see chart for details).     Patient in emergency department with dizziness, blurred vision, headache, elevated blood pressure of 170 systolic while at work today.  No history of hypertension.  She does have history of migraines and this could be atypical for her migraine, however she states that the headache is not severe and the blurred vision is more pronounced than usual.  I will check blood counts, electrolytes, blood sugar, kidney function.  Most recent blood pressure here is 143/94.  Otherwise normal vital signs.  2:59 PM No significant abnormalities on labs. BP continues to be in 140s systolic. Pt with no focal findings on exam. Stable for dc home. Will have pt keep tract of her BPs  at home and follow up with pcp in 1-2 weeks. Return precautions discussed.   Vitals:   12/22/17 1252 12/22/17 1455  BP: (!) 143/94 (!) 149/92  Pulse: 79 91  Resp: 16 16  Temp: 98 F (36.7 C)   TempSrc: Oral   SpO2: 100%   Weight: 95.3 kg (210 lb)   Height: 5\' 6"  (1.676 m)      Final Clinical Impressions(s) / ED Diagnoses   Final diagnoses:  Secondary hypertension  Blurred vision, bilateral  Nonintractable headache, unspecified chronicity pattern, unspecified headache type    ED Discharge Orders    None       Jaynie Crumble, PA-C 12/22/17 1501    Benjiman Core, MD 12/22/17 2252

## 2017-12-22 NOTE — Discharge Instructions (Signed)
Avoid stressors. Low sodium diet. Continue regular medications. Check your BP once a day, keep track. Follow up with family doctor in 1-2 weeks. Return if worsening symptoms.

## 2017-12-22 NOTE — ED Triage Notes (Addendum)
Pt endorses feeling weird at work with headache and some blurred vision and had a "small panic attack", checked her BP at work 177/113. Sx have resolved, pt ambulatory, no neuro deficits. BP 143/94 in triage. VSS,. NAD. No hx of htn. Takes daily meds for chronic migraines.

## 2018-10-19 ENCOUNTER — Encounter (HOSPITAL_BASED_OUTPATIENT_CLINIC_OR_DEPARTMENT_OTHER): Payer: Self-pay | Admitting: Emergency Medicine

## 2018-10-19 ENCOUNTER — Other Ambulatory Visit: Payer: Self-pay

## 2018-10-19 DIAGNOSIS — Z87891 Personal history of nicotine dependence: Secondary | ICD-10-CM | POA: Diagnosis not present

## 2018-10-19 DIAGNOSIS — R1031 Right lower quadrant pain: Secondary | ICD-10-CM | POA: Insufficient documentation

## 2018-10-19 DIAGNOSIS — Z79899 Other long term (current) drug therapy: Secondary | ICD-10-CM | POA: Diagnosis not present

## 2018-10-19 LAB — CBC
HCT: 38 % (ref 36.0–46.0)
Hemoglobin: 12 g/dL (ref 12.0–15.0)
MCH: 27.6 pg (ref 26.0–34.0)
MCHC: 31.6 g/dL (ref 30.0–36.0)
MCV: 87.4 fL (ref 80.0–100.0)
Platelets: 246 10*3/uL (ref 150–400)
RBC: 4.35 MIL/uL (ref 3.87–5.11)
RDW: 12.1 % (ref 11.5–15.5)
WBC: 7.1 10*3/uL (ref 4.0–10.5)
nRBC: 0 % (ref 0.0–0.2)

## 2018-10-19 LAB — COMPREHENSIVE METABOLIC PANEL
ALT: 17 U/L (ref 0–44)
AST: 15 U/L (ref 15–41)
Albumin: 3.6 g/dL (ref 3.5–5.0)
Alkaline Phosphatase: 50 U/L (ref 38–126)
Anion gap: 7 (ref 5–15)
BUN: 13 mg/dL (ref 6–20)
CALCIUM: 8.7 mg/dL — AB (ref 8.9–10.3)
CO2: 22 mmol/L (ref 22–32)
Chloride: 106 mmol/L (ref 98–111)
Creatinine, Ser: 0.56 mg/dL (ref 0.44–1.00)
GFR calc Af Amer: >60 mL/min (ref >60)
GFR calc non Af Amer: >60 mL/min (ref >60)
Glucose, Bld: 102 mg/dL — ABNORMAL HIGH (ref 70–99)
Potassium: 3.6 mmol/L (ref 3.5–5.1)
Sodium: 135 mmol/L (ref 135–145)
Total Bilirubin: 0.1 mg/dL — ABNORMAL LOW (ref 0.3–1.2)
Total Protein: 6.6 g/dL (ref 6.5–8.1)

## 2018-10-19 LAB — URINALYSIS, ROUTINE W REFLEX MICROSCOPIC
Bilirubin Urine: NEGATIVE
Glucose, UA: NEGATIVE mg/dL
Ketones, ur: NEGATIVE mg/dL
Nitrite: NEGATIVE
Protein, ur: NEGATIVE mg/dL
Specific Gravity, Urine: 1.015 (ref 1.005–1.030)
pH: 7 (ref 5.0–8.0)

## 2018-10-19 LAB — URINALYSIS, MICROSCOPIC (REFLEX)

## 2018-10-19 LAB — PREGNANCY, URINE: Preg Test, Ur: NEGATIVE

## 2018-10-19 LAB — LIPASE, BLOOD: Lipase: 31 U/L (ref 11–51)

## 2018-10-19 NOTE — ED Triage Notes (Signed)
Pt c/o 7/10 bilateral lower abd pain started today, pt denies any nausea, vomiting, diarrhea no urinary symptoms.

## 2018-10-20 ENCOUNTER — Emergency Department (HOSPITAL_BASED_OUTPATIENT_CLINIC_OR_DEPARTMENT_OTHER): Payer: BLUE CROSS/BLUE SHIELD

## 2018-10-20 ENCOUNTER — Emergency Department (HOSPITAL_BASED_OUTPATIENT_CLINIC_OR_DEPARTMENT_OTHER)
Admission: EM | Admit: 2018-10-20 | Discharge: 2018-10-20 | Disposition: A | Discharge status: Home / Self Care | Payer: BLUE CROSS/BLUE SHIELD | Attending: Emergency Medicine | Admitting: Emergency Medicine

## 2018-10-20 DIAGNOSIS — R109 Unspecified abdominal pain: Secondary | ICD-10-CM

## 2018-10-20 MED ORDER — TRAMADOL HCL 50 MG PO TABS: 50.0000 mg | ORAL_TABLET | Freq: Four times a day (QID) | ORAL | 0 refills | Status: DC | PRN

## 2018-10-20 MED ORDER — IOPAMIDOL (ISOVUE-300) INJECTION 61%
100.0000 mL | Freq: Once | INTRAVENOUS | Status: AC | PRN
  Administered 2018-10-20: 100 mL via INTRAVENOUS

## 2018-10-20 NOTE — Discharge Instructions (Signed)
Tramadol as prescribed as needed for pain.  Follow-up with your primary doctor in the next week, and return to the ER if you develop high fever, worsening pain, bloody stool, or other new and concerning symptoms.

## 2018-10-20 NOTE — ED Provider Notes (Signed)
[facility] EMERGENCY DEPARTMENT Provider Note   CSN: [id number] Arrival date & time: 10/19/18  2123     History   Chief Complaint Chief Complaint  Patient presents with  . Abdominal Pain    HPI Crystal Sheppard is a 27 y.o. female.  Patient is a 27 year old female with history of polycystic ovaries.  She presents today for evaluation of abdominal pain.  She describes generalized abdominal pain that has been present throughout the day.  She denies any fevers or chills.  She denies any nausea, vomiting, or diarrhea.  The history is provided by the patient.  Abdominal Pain   This is a new problem. Episode onset: This morning. The problem occurs constantly. The problem has been gradually worsening. The pain is associated with an unknown factor. The pain is located in the generalized abdominal region.    Past Medical History:  Diagnosis Date  . Dysmetabolic syndrome X   . Hirsutism   . Migraines   . Other specified complication, antepartum(646.83)    headaches  . Polycystic ovarian disease   . Polycystic ovaries     Patient Active Problem List   Diagnosis Date Noted  . Second-degree perineal laceration, with delivery 10/08/2016  . Postpartum care following vaginal delivery (12/18) 10/07/2016  . Anemia of mother in pregnancy, delivered 09/26/2012    History reviewed. No pertinent surgical history.   OB History    Gravida  3   Para  2   Term  2   Preterm  0   AB  1   Living  2     SAB  0   TAB  1   Ectopic  0   Multiple  0   Live Births  2            Home Medications    Prior to Admission medications   Medication Sig Start Date End Date Taking? Authorizing Provider  acetaminophen (TYLENOL) 500 MG chewable tablet Chew 1,000 mg by mouth every 6 (six) hours as needed for pain.    [provider]  magnesium oxide (MAG-OX) 400 (241.3 Mg) MG tablet Take 1 tablet (400 mg total) by mouth daily. 10/09/16   Raelyn Moraawson, Rolitta, CNM    norethindrone-ethinyl estradiol 1/35 (ORTHO-NOVUM 1/35, 28,) tablet Take 1 tablet by mouth daily. 12/04/17   Genia DelLavoie, Marie-Lyne, MD  topiramate (TOPAMAX) 200 MG tablet Take 200 mg by mouth 2 (two) times daily.    [provider]    Family History Family History  Problem Relation Age of Onset  . Asthma Mother   . Hypertension Mother   . Hypertension Brother   . Asthma Brother   . Diabetes Paternal Grandmother   . Mental retardation Cousin        downs    Social History Social History   Tobacco Use  . Smoking status: Former Games developermoker  . Smokeless tobacco: Former Engineer, waterUser  Substance Use Topics  . Alcohol use: Yes    Comment: occasionally  . Drug use: No     Allergies   Patient has no known allergies.   Review of Systems Review of Systems  Gastrointestinal: Positive for abdominal pain.     Physical Exam Updated Vital Signs BP (!) 151/88 (BP Location: Left Arm)   Pulse 89   Temp 98.2 F (36.8 C) (Oral)   Resp 16   Ht 5\' 6"  (1.676 m)   Wt 107.5 kg   SpO2 100%   BMI 38.25 kg/m   Physical  Exam Vitals signs and nursing note reviewed.  Constitutional:      General: She is not in acute distress.    Appearance: She is well-developed. She is not diaphoretic.  HENT:     Head: Normocephalic and atraumatic.  Neck:     Musculoskeletal: Normal range of motion and neck supple.  Cardiovascular:     Rate and Rhythm: Normal rate and regular rhythm.     Heart sounds: No murmur. No friction rub. No gallop.   Pulmonary:     Effort: Pulmonary effort is normal. No respiratory distress.     Breath sounds: Normal breath sounds. No wheezing.  Abdominal:     General: Bowel sounds are normal. There is no distension.     Palpations: Abdomen is soft.     Tenderness: There is generalized abdominal tenderness. There is no guarding or rebound.  Musculoskeletal: Normal range of motion.  Skin:    General: Skin is warm and dry.  Neurological:     Mental Status: She is alert and  oriented to person, place, and time.      ED Treatments / Results  Labs (all labs ordered are listed, but only abnormal results are displayed) Labs Reviewed  COMPREHENSIVE METABOLIC PANEL - Abnormal; Notable for the following components:      Result Value   Glucose, Bld 102 (*)    Calcium 8.7 (*)    Total Bilirubin 0.1 (*)    All other components within normal limits  URINALYSIS, ROUTINE W REFLEX MICROSCOPIC - Abnormal; Notable for the following components:   Hgb urine dipstick SMALL (*)    Leukocytes, UA TRACE (*)    All other components within normal limits  URINALYSIS, MICROSCOPIC (REFLEX) - Abnormal; Notable for the following components:   Bacteria, UA MANY (*)    All other components within normal limits  LIPASE, BLOOD  CBC  PREGNANCY, URINE    EKG None  Radiology No results found.  Procedures Procedures (including critical care time)  Medications Ordered in ED Medications - No data to display   Initial Impression / Assessment and Plan / ED Course  I have reviewed the triage vital signs and the nursing notes.  Pertinent labs & imaging results that were available during my care of the patient were reviewed by me and considered in my medical decision making (see chart for details).  Patient presenting here with a 2-day history of lower abdominal pain.  Her laboratory studies are reassuring.  Pregnancy test is negative.  A CT scan was obtained which does show free fluid in the pelvis consistent with a possible ruptured ovarian cyst.  At this point, the patient appears well and I believe is acceptable for discharge.  I have advised her to follow-up with her primary doctor in the next week.  Final Clinical Impressions(s) / ED Diagnoses   Final diagnoses:  None    ED Discharge Orders    None       Geoffery Lyonselo, Blakleigh Straw, MD 10/20/18 [phone]

## 2019-01-28 ENCOUNTER — Other Ambulatory Visit: Payer: Self-pay | Admitting: Obstetrics & Gynecology

## 2019-02-23 ENCOUNTER — Other Ambulatory Visit: Payer: Self-pay

## 2019-02-24 ENCOUNTER — Encounter: Payer: Self-pay | Admitting: Obstetrics & Gynecology

## 2019-02-24 ENCOUNTER — Ambulatory Visit (INDEPENDENT_AMBULATORY_CARE_PROVIDER_SITE_OTHER): Payer: BLUE CROSS/BLUE SHIELD | Admitting: Obstetrics & Gynecology

## 2019-02-24 VITALS — BP 120/70 | Ht 66.0 in | Wt 239.0 lb

## 2019-02-24 DIAGNOSIS — Z01419 Encounter for gynecological examination (general) (routine) without abnormal findings: Secondary | ICD-10-CM | POA: Diagnosis not present

## 2019-02-24 DIAGNOSIS — E6609 Other obesity due to excess calories: Secondary | ICD-10-CM

## 2019-02-24 DIAGNOSIS — Z30015 Encounter for initial prescription of vaginal ring hormonal contraceptive: Secondary | ICD-10-CM

## 2019-02-24 DIAGNOSIS — R87610 Atypical squamous cells of undetermined significance on cytologic smear of cervix (ASC-US): Secondary | ICD-10-CM

## 2019-02-24 DIAGNOSIS — Z6838 Body mass index (BMI) 38.0-38.9, adult: Secondary | ICD-10-CM

## 2019-02-24 MED ORDER — ETONOGESTREL-ETHINYL ESTRADIOL 0.12-0.015 MG/24HR VA RING: 1.0000 | VAGINAL_RING | VAGINAL | 4 refills | Status: DC

## 2019-02-24 NOTE — Progress Notes (Signed)
Crystal Sheppard 11/11/1990 [id number]   History:    28 y.o. [id number] Married.  Daughters 2 and 516 yo.  RP:  Established patient presenting for annual gyn exam   HPI: Well on BCPs, but difficulty with compliance, would like to be back on Nuvaring.  No BTB.  No pelvic pain.  No pain with IC.  Urine/BMs normal.  Breasts normal.  BMI increased x last year, now at 38.58.  Needs to increase physical activity.  Health labs with family physician.  Past medical history,surgical history, family history and social history were all reviewed and documented in the EPIC chart.  Gynecologic History Patient's last menstrual period was 02/12/2019. Contraception: OCP (estrogen/progesterone) Last Pap: 11/2017. Results were: ASCUS/HPV HR negative Last mammogram: Never Bone Density: Never Colonoscopy: Never  Obstetric History OB History  Gravida Para Term Preterm AB Living  3 2 2  0 1 2  SAB TAB Ectopic Multiple Live Births  0 1 0 0 2    # Outcome Date GA Lbr Len/2nd Weight Sex Delivery Anes PTL Lv  3 Term 10/07/16 903w1d 02:15 / 00:21 7 lb 9.2 oz (3.436 kg) F Vag-Spont EPI, Local  LIV  2 Term 09/25/12 2733w4d 05:12 / 00:14 7 lb 6.3 oz (3.354 kg) F Vag-Spont None  LIV     Birth Comments: WNL  1 TAB              ROS: A ROS was performed and pertinent positives and negatives are included in the history.  GENERAL: No fevers or chills. HEENT: No change in vision, no earache, sore throat or sinus congestion. NECK: No pain or stiffness. CARDIOVASCULAR: No chest pain or pressure. No palpitations. PULMONARY: No shortness of breath, cough or wheeze. GASTROINTESTINAL: No abdominal pain, nausea, vomiting or diarrhea, melena or bright red blood per rectum. GENITOURINARY: No urinary frequency, urgency, hesitancy or dysuria. MUSCULOSKELETAL: No joint or muscle pain, no back pain, no recent trauma. DERMATOLOGIC: No rash, no itching, no lesions. ENDOCRINE: No polyuria, polydipsia, no heat or cold intolerance. No recent  change in weight. HEMATOLOGICAL: No anemia or easy bruising or bleeding. NEUROLOGIC: No headache, seizures, numbness, tingling or weakness. PSYCHIATRIC: No depression, no loss of interest in normal activity or change in sleep pattern.     Exam:   BP 120/70   Ht 5\' 6"  (1.676 m)   Wt 239 lb (108.4 kg)   LMP 02/12/2019 Comment: pill  BMI 38.58 kg/m   Body mass index is 38.58 kg/m.  General appearance : Well developed well nourished female. No acute distress HEENT: Eyes: no retinal hemorrhage or exudates,  Neck supple, trachea midline, no carotid bruits, no thyroidmegaly Lungs: Clear to auscultation, no rhonchi or wheezes, or rib retractions  Heart: Regular rate and rhythm, no murmurs or gallops Breast:Examined in sitting and supine position were symmetrical in appearance, no palpable masses or tenderness,  no skin retraction, no nipple inversion, no nipple discharge, no skin discoloration, no axillary or supraclavicular lymphadenopathy Abdomen: no palpable masses or tenderness, no rebound or guarding Extremities: no edema or skin discoloration or tenderness  Pelvic: Vulva: Normal             Vagina: No gross lesions or discharge  Cervix: No gross lesions or discharge.  Pap reflex done.  Uterus  AV, normal size, shape and consistency, non-tender and mobile  Adnexa  Without masses or tenderness  Anus: Normal   Assessment/Plan:  28 y.o. female for annual exam   1. Encounter for routine  gynecological examination with Papanicolaou smear of cervix Normal gynecologic exam.  Pap reflex done.  Breast exam normal.  2. ASCUS of cervix with negative high risk HPV Pap reflex done today.  3. Encounter for initial prescription of vaginal ring hormonal contraceptive Difficulty with compliance on birth control pills.  Had used NuvaRing in the past and would like to be back on it.  No contraindication to NuvaRing.  Usage reviewed and prescription sent to pharmacy.  4. Class 2 obesity due to  excess calories without serious comorbidity with body mass index (BMI) of 38.0 to 38.9 in adult Low calorie/carb diet such as [employer] recommended.  Increase physical activity with aerobic activities 5 times a week and weightlifting every 2 days.  Other orders - etonogestrel-ethinyl estradiol (NUVARING) 0.12-0.015 MG/24HR vaginal ring; Place 1 each vaginally every 28 (twenty-eight) days. Insert vaginally and leave in place for 3 consecutive weeks, then remove for 1 week.  Genia Del MD, 11:20 AM 02/24/2019

## 2019-02-25 LAB — PAP IG W/ RFLX HPV ASCU

## 2019-02-26 ENCOUNTER — Encounter: Payer: Self-pay | Admitting: Obstetrics & Gynecology

## 2019-02-26 NOTE — Patient Instructions (Signed)
1. Encounter for routine gynecological examination with Papanicolaou smear of cervix Normal gynecologic exam.  Pap reflex done.  Breast exam normal.  2. ASCUS of cervix with negative high risk HPV Pap reflex done today.  3. Encounter for initial prescription of vaginal ring hormonal contraceptive Difficulty with compliance on birth control pills.  Had used NuvaRing in the past and would like to be back on it.  No contraindication to NuvaRing.  Usage reviewed and prescription sent to pharmacy.  4. Class 2 obesity due to excess calories without serious comorbidity with body mass index (BMI) of 38.0 to 38.9 in adult Low calorie/carb diet such as [employer] recommended.  Increase physical activity with aerobic activities 5 times a week and weightlifting every 2 days.  Other orders - etonogestrel-ethinyl estradiol (NUVARING) 0.12-0.015 MG/24HR vaginal ring; Place 1 each vaginally every 28 (twenty-eight) days. Insert vaginally and leave in place for 3 consecutive weeks, then remove for 1 week.  Cydney, it was a pleasure seeing you today!  I will inform you of your results as soon as they are available.

## 2019-09-28 ENCOUNTER — Encounter: Payer: Self-pay | Admitting: Obstetrics & Gynecology

## 2019-09-28 ENCOUNTER — Other Ambulatory Visit: Payer: Self-pay

## 2019-09-28 ENCOUNTER — Telehealth (INDEPENDENT_AMBULATORY_CARE_PROVIDER_SITE_OTHER): Payer: BC Managed Care – PPO | Admitting: Obstetrics & Gynecology

## 2019-09-28 DIAGNOSIS — N643 Galactorrhea not associated with childbirth: Secondary | ICD-10-CM

## 2019-09-28 DIAGNOSIS — R350 Frequency of micturition: Secondary | ICD-10-CM | POA: Diagnosis not present

## 2019-09-28 DIAGNOSIS — Z3044 Encounter for surveillance of vaginal ring hormonal contraceptive device: Secondary | ICD-10-CM

## 2019-09-28 DIAGNOSIS — R102 Pelvic and perineal pain: Secondary | ICD-10-CM | POA: Diagnosis not present

## 2019-09-28 NOTE — Progress Notes (Signed)
    Crystal Sheppard June 01, 1991 [id number]        28 y.o.  [id number] Married.  Televisit on the phone after consent.  Visit conducted between the patient at home and myself in my [place] office.  Counseling x 15 minutes.  RP: Pelvic discomfort, urinary frequency and nausea x 1 week  HPI: Well on Nuvaring until about 2 weeks ago when started having pelvic discomfort and urinary frequency.  No dysuria, no blood seen in urine.  No abnormal vaginal discharge.  Some nausea, no vomiting. Clear nipple discharge bilaterally.  No fever.  Many HPT done, all negative.     OB History  Gravida Para Term Preterm AB Living  3 2 2  0 1 2  SAB TAB Ectopic Multiple Live Births  0 1 0 0 2    # Outcome Date GA Lbr Len/2nd Weight Sex Delivery Anes PTL Lv  3 Term 10/07/16 [redacted]w[redacted]d 02:15 / 00:21 7 lb 9.2 oz (3.436 kg) F Vag-Spont EPI, Local  LIV  2 Term 09/25/12 [redacted]w[redacted]d 05:12 / 00:14 7 lb 6.3 oz (3.354 kg) F Vag-Spont None  LIV     Birth Comments: WNL  1 TAB             Past medical history,surgical history, problem list, medications, allergies, family history and social history were all reviewed and documented in the EPIC chart.   Directed ROS with pertinent positives and negatives documented in the history of present illness/assessment and plan.  Exam:  There were no vitals filed for this visit. General appearance:  Normal   Assessment/Plan:  28 y.o. [id number]   1. Pelvic pain in female Mild pelvic pain/discomfort.  No vaginal discharge.  No fever.  Very little withdrawal bleeding recently on Nuvaring. - CBC; Future - hCG, serum, qualitative; Future - US Transvaginal Non-OB; Future  2. Urinary frequency No dysuria or blood in urine.  No fever. - Urinalysis,Complete w/RFL Culture; Future  3. 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 - Prolactin; Future  4. Encounter for surveillance of vaginal ring hormonal contraceptive device Will continue on Nuvaring until office visit.  Will decide on  management per exam, labs and pelvic US results.  Other orders - rizatriptan (MAXALT) 10 MG tablet; Take 10 mg by mouth as needed for migraine. May repeat in 2 hours if needed - Erenumab-aooe (AIMOVIG) 70 MG/ML SOAJ; Inject into the skin.   Princess Bruins MD, 3:44 PM 09/28/2019

## 2019-09-28 NOTE — Telephone Encounter (Signed)
Spoke with patient she wants to do a video visit; scheduled for today at 3:00 with Dr Dellis Filbert.

## 2019-09-30 ENCOUNTER — Other Ambulatory Visit: Payer: Self-pay

## 2019-09-30 ENCOUNTER — Encounter: Payer: Self-pay | Admitting: Obstetrics & Gynecology

## 2019-09-30 ENCOUNTER — Ambulatory Visit (INDEPENDENT_AMBULATORY_CARE_PROVIDER_SITE_OTHER): Payer: BC Managed Care – PPO

## 2019-09-30 ENCOUNTER — Ambulatory Visit: Payer: BC Managed Care – PPO | Admitting: Obstetrics & Gynecology

## 2019-09-30 DIAGNOSIS — R350 Frequency of micturition: Secondary | ICD-10-CM | POA: Diagnosis not present

## 2019-09-30 DIAGNOSIS — N643 Galactorrhea not associated with childbirth: Secondary | ICD-10-CM

## 2019-09-30 DIAGNOSIS — R102 Pelvic and perineal pain unspecified side: Secondary | ICD-10-CM

## 2019-09-30 LAB — CBC
HCT: 38.1 % (ref 35.0–45.0)
Hemoglobin: 12.5 g/dL (ref 11.7–15.5)
MCH: 27.5 pg (ref 27.0–33.0)
MCHC: 32.8 g/dL (ref 32.0–36.0)
MCV: 83.9 fL (ref 80.0–100.0)
MPV: 9.8 fL (ref 7.5–12.5)
Platelets: 255 10*3/uL (ref 140–400)
RBC: 4.54 10*6/uL (ref 3.80–5.10)
RDW: 12.5 % (ref 11.0–15.0)
WBC: 5.4 10*3/uL (ref 3.8–10.8)

## 2019-09-30 LAB — PROLACTIN: Prolactin: 7.7 ng/mL

## 2019-09-30 LAB — HCG, SERUM, QUALITATIVE: Preg, Serum: NEGATIVE

## 2019-09-30 NOTE — Progress Notes (Signed)
    Crystal Sheppard 1991/03/12 [id number]        28 y.o.  [id number] Married.   RP: Pelvic discomfort, urinary frequency and nausea x 1 week  HPI: No change since 09/28/19 at Televisit:  Well on Nuvaring until about 2 weeks ago when started having pelvic discomfort and urinary frequency.  No dysuria, no blood seen in urine.  No abnormal vaginal discharge.  Some nausea, no vomiting. Clear nipple discharge bilaterally.  No fever. Many HPT done, all negative.     OB History  Gravida Para Term Preterm AB Living  3 2 2  0 1 2  SAB TAB Ectopic Multiple Live Births  0 1 0 0 2    # Outcome Date GA Lbr Len/2nd Weight Sex Delivery Anes PTL Lv  3 Term 10/07/16 [redacted]w[redacted]d 02:15 / 00:21 7 lb 9.2 oz (3.436 kg) F Vag-Spont EPI, Local  LIV  2 Term 09/25/12 [redacted]w[redacted]d 05:12 / 00:14 7 lb 6.3 oz (3.354 kg) F Vag-Spont None  LIV     Birth Comments: WNL  1 TAB             Past medical history,surgical history, problem list, medications, allergies, family history and social history were all reviewed and documented in the EPIC chart.   Directed ROS with pertinent positives and negatives documented in the history of present illness/assessment and plan.  Exam:  Vitals:   09/30/19 1059  BP: 132/88   General appearance:  Normal  Breast exam: Bilateral breast and axilla normal.  Very very mild clear nipple discharge after pressing the left nipple.  Pelvic US today: T/V images.  Retroverted uterus normal in size and shape with no myometrial mass measuring 6.23 x 5.07 x 3.99 cm.  The endometrial lining is thin and symmetrical with no mass or thickening, measuring 3.96 mm.  The right ovary is normal.  The left ovary is normal with 2 simple follicles measuring 2.8 x 2.2 cm and 1.3 x 1.2 cm.  Both ovaries with normal perfusion.  No adnexal mass seen.  No free fluid in the posterior cul-de-sac.  U/A: Yellow cloudy, protein negative, nitrites negative, white blood cells 0-5, red blood cells 3-10, moderate bacteria,  moderate mucus.  Pending urine culture.   Assessment/Plan:  28 y.o. [id number]   1. Pelvic pain in female Pelvic ultrasound reveals a normal uterus with normal thin endometrial lining and normal bilateral ovaries with 2 simple follicles on the left ovary.  No adnexal mass and no free fluid in the pelvis.  Patient reassured.  Qualitative pregnancy test done today.  Pending results.  CBC to check white blood cell count. - hCG, serum, qualitative - CBC  2. 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  Breast exam otherwise negative.  Will check prolactin.  Recommend not pressing on the nipples to check on discharge anymore. - Prolactin  3. Urinary frequency Mild disturbance in urine analysis.  Increase water intake.  Will wait on urine culture to decide on treatment. - Urinalysis,Complete w/RFL Culture  Counseling on above issues and coordination of care more than 50% for 25 minutes.  Princess Bruins MD, 11:24 AM 09/30/2019

## 2019-10-02 LAB — URINALYSIS, COMPLETE W/RFL CULTURE
Bilirubin Urine: NEGATIVE
Glucose, UA: NEGATIVE
Hyaline Cast: NONE SEEN /LPF
Leukocyte Esterase: NEGATIVE
Nitrites, Initial: NEGATIVE
Protein, ur: NEGATIVE
Specific Gravity, Urine: 1.027 (ref 1.001–1.03)
pH: 5.5 (ref 5.0–8.0)

## 2019-10-02 LAB — URINE CULTURE
MICRO NUMBER:: 1184738
SPECIMEN QUALITY:: ADEQUATE

## 2019-10-02 LAB — CULTURE INDICATED

## 2019-10-03 ENCOUNTER — Encounter: Payer: Self-pay | Admitting: Obstetrics & Gynecology

## 2019-10-03 ENCOUNTER — Other Ambulatory Visit: Payer: Self-pay | Admitting: Obstetrics & Gynecology

## 2019-10-03 MED ORDER — SULFAMETHOXAZOLE-TRIMETHOPRIM 800-160 MG PO TABS: 1.0000 | ORAL_TABLET | Freq: Two times a day (BID) | ORAL | 0 refills | Status: AC

## 2019-10-03 NOTE — Patient Instructions (Signed)
1. Pelvic pain in female Pelvic ultrasound reveals a normal uterus with normal thin endometrial lining and normal bilateral ovaries with 2 simple follicles on the left ovary.  No adnexal mass and no free fluid in the pelvis.  Patient reassured.  Qualitative pregnancy test done today.  Pending results.  CBC to check white blood cell count. - hCG, serum, qualitative - CBC  2. 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  Breast exam otherwise negative.  Will check prolactin.  Recommend not pressing on the nipples to check on discharge anymore. - Prolactin  3. Urinary frequency Mild disturbance in urine analysis.  Increase water intake.  Will wait on urine culture to decide on treatment. - Urinalysis,Complete w/RFL Culture  Crystal Sheppard, it was a pleasure seeing you today!  I will inform you of your results as soon as they are available.

## 2020-02-25 ENCOUNTER — Encounter: Payer: BLUE CROSS/BLUE SHIELD | Admitting: Obstetrics & Gynecology

## 2020-03-06 ENCOUNTER — Other Ambulatory Visit: Payer: Self-pay | Admitting: Obstetrics & Gynecology

## 2020-03-07 NOTE — Telephone Encounter (Signed)
Annual exam scheduled on 04/28/20

## 2020-03-21 ENCOUNTER — Other Ambulatory Visit: Payer: Self-pay

## 2020-03-22 MED ORDER — ETONOGESTREL-ETHINYL ESTRADIOL 0.12-0.015 MG/24HR VA RING: VAGINAL_RING | VAGINAL | 0 refills | Status: DC

## 2020-03-23 ENCOUNTER — Other Ambulatory Visit: Payer: Self-pay

## 2020-04-28 ENCOUNTER — Encounter: Payer: BC Managed Care – PPO | Admitting: Obstetrics & Gynecology

## 2020-05-19 ENCOUNTER — Encounter: Payer: Self-pay | Admitting: Obstetrics & Gynecology

## 2020-05-19 ENCOUNTER — Other Ambulatory Visit: Payer: Self-pay

## 2020-05-19 ENCOUNTER — Ambulatory Visit (INDEPENDENT_AMBULATORY_CARE_PROVIDER_SITE_OTHER): Payer: BC Managed Care – PPO | Admitting: Obstetrics & Gynecology

## 2020-05-19 VITALS — BP 124/80 | Ht 66.0 in | Wt 221.0 lb

## 2020-05-19 DIAGNOSIS — Z6835 Body mass index (BMI) 35.0-35.9, adult: Secondary | ICD-10-CM | POA: Diagnosis not present

## 2020-05-19 DIAGNOSIS — Z01419 Encounter for gynecological examination (general) (routine) without abnormal findings: Secondary | ICD-10-CM | POA: Diagnosis not present

## 2020-05-19 DIAGNOSIS — Z30015 Encounter for initial prescription of vaginal ring hormonal contraceptive: Secondary | ICD-10-CM | POA: Diagnosis not present

## 2020-05-19 DIAGNOSIS — E6609 Other obesity due to excess calories: Secondary | ICD-10-CM | POA: Diagnosis not present

## 2020-05-19 MED ORDER — ETONOGESTREL-ETHINYL ESTRADIOL 0.12-0.015 MG/24HR VA RING: VAGINAL_RING | VAGINAL | 4 refills | Status: DC

## 2020-05-19 NOTE — Progress Notes (Addendum)
Crystal Sheppard 12/01/1990 [id number]   History:    30 y.o. G3P2A1L2 Married.  Daughters 3 and 70 yo.  RP:  Established patient presenting for annual gyn exam   HPI: Was well on Nuvaring, but ran out x 1 month.  Would like to restart.  No BTB.  No pelvic pain, except for ovulatory pain and pelvic cramps pre-menses when off of Nuvaring.  No pain with IC.  Urine/BMs normal.  Breasts normal.  BMI improved x last year, now at 35.67. Needs to continue with increased physical activity and lower calories/carbs.  Health labs with family physician.   Past medical history,surgical history, family history and social history were all reviewed and documented in the EPIC chart.  Gynecologic History Patient's last menstrual period was 04/28/2020.  Obstetric History OB History  Gravida Para Term Preterm AB Living  3 2 2  0 1 2  SAB TAB Ectopic Multiple Live Births  0 1 0 0 2    # Outcome Date GA Lbr Len/2nd Weight Sex Delivery Anes PTL Lv  3 Term 10/07/16 [redacted]w[redacted]d 02:15 / 00:21 7 lb 9.2 oz (3.436 kg) F Vag-Spont EPI, Local  LIV  2 Term 09/25/12 [redacted]w[redacted]d 05:12 / 00:14 7 lb 6.3 oz (3.354 kg) F Vag-Spont None  LIV     Birth Comments: WNL  1 TAB              ROS: A ROS was performed and pertinent positives and negatives are included in the history.  GENERAL: No fevers or chills. HEENT: No change in vision, no earache, sore throat or sinus congestion. NECK: No pain or stiffness. CARDIOVASCULAR: No chest pain or pressure. No palpitations. PULMONARY: No shortness of breath, cough or wheeze. GASTROINTESTINAL: No abdominal pain, nausea, vomiting or diarrhea, melena or bright red blood per rectum. GENITOURINARY: No urinary frequency, urgency, hesitancy or dysuria. MUSCULOSKELETAL: No joint or muscle pain, no back pain, no recent trauma. DERMATOLOGIC: No rash, no itching, no lesions. ENDOCRINE: No polyuria, polydipsia, no heat or cold intolerance. No recent change in weight. HEMATOLOGICAL: No anemia or easy  bruising or bleeding. NEUROLOGIC: No headache, seizures, numbness, tingling or weakness. PSYCHIATRIC: No depression, no loss of interest in normal activity or change in sleep pattern.     Exam:   BP 124/80   Ht 5\' 6"  (1.676 m)   Wt (!) 221 lb (100.2 kg)   LMP 04/28/2020   BMI 35.67 kg/m   Body mass index is 35.67 kg/m.  General appearance : Well developed well nourished female. No acute distress HEENT: Eyes: no retinal hemorrhage or exudates,  Neck supple, trachea midline, no carotid bruits, no thyroidmegaly Lungs: Clear to auscultation, no rhonchi or wheezes, or rib retractions  Heart: Regular rate and rhythm, no murmurs or gallops Breast:Examined in sitting and supine position were symmetrical in appearance, no palpable masses or tenderness,  no skin retraction, no nipple inversion, no nipple discharge, no skin discoloration, no axillary or supraclavicular lymphadenopathy Abdomen: no palpable masses or tenderness, no rebound or guarding Extremities: no edema or skin discoloration or tenderness  Pelvic: Vulva: Normal             Vagina: No gross lesions or discharge  Cervix: No gross lesions or discharge.  Pap reflex done.  Uterus  AV, normal size, shape and consistency, non-tender and mobile  Adnexa  Without masses or tenderness  Anus: Normal   Assessment/Plan:  29 y.o. female for annual exam   1. Well female exam with  routine gynecological exam with Pap test Normal gynecologic exam.  Pap test normal in May 2020, repeated today.  Breast exam normal.  Health labs with family MD.  2. Encounter for initial prescription of vaginal ring hormonal contraceptive Has been well on NuvaRing.  Desires to restart.  No contraindication.  Usage known.  Prescription sent to pharmacy.  3. Class 2 obesity due to excess calories without serious comorbidity with body mass index (BMI) of 35.0 to 35.9 in adult Recommend to continue on low calorie/carb diet.  Aerobic activities 5 times a week and  light weightlifting every 2 days.  Other orders - etonogestrel-ethinyl estradiol (NUVARING) 0.12-0.015 MG/24HR vaginal ring; Insert vaginally and leave in place for 3 consecutive weeks, then remove for 1 week.  Genia Del MD, 4:20 PM 05/19/2020

## 2020-05-20 ENCOUNTER — Encounter: Payer: Self-pay | Admitting: Obstetrics & Gynecology

## 2020-05-22 NOTE — Addendum Note (Signed)
Addended by: Berna Spare A on: 05/22/2020 12:23 PM   Modules accepted: Orders

## 2020-05-24 LAB — PAP IG W/ RFLX HPV ASCU

## 2020-06-09 ENCOUNTER — Ambulatory Visit (INDEPENDENT_AMBULATORY_CARE_PROVIDER_SITE_OTHER): Payer: BC Managed Care – PPO | Admitting: Obstetrics & Gynecology

## 2020-06-09 ENCOUNTER — Encounter: Payer: Self-pay | Admitting: Obstetrics & Gynecology

## 2020-06-09 ENCOUNTER — Other Ambulatory Visit: Payer: Self-pay

## 2020-06-09 VITALS — BP 140/84

## 2020-06-09 DIAGNOSIS — Z3201 Encounter for pregnancy test, result positive: Secondary | ICD-10-CM

## 2020-06-09 DIAGNOSIS — N912 Amenorrhea, unspecified: Secondary | ICD-10-CM

## 2020-06-09 DIAGNOSIS — Z3491 Encounter for supervision of normal pregnancy, unspecified, first trimester: Secondary | ICD-10-CM

## 2020-06-09 LAB — PREGNANCY, URINE: Preg Test, Ur: POSITIVE — AB

## 2020-06-09 MED ORDER — PRENATAL VITAMIN 27-0.8 MG PO TABS: 1.0000 | ORAL_TABLET | Freq: Every day | ORAL | 5 refills | Status: DC

## 2020-06-09 NOTE — Progress Notes (Signed)
    Crystal OLSHEFSKI 01/20/1991 [id number]        29 y.o.  G3P2A1L2  Married.  LMP 04/28/2020  RP: HPT positive  HPI: Ran out of Nuvaring x 1 month in July.  LMP 04/28/2020.  Sexually active without contraception in July.  Started back on Nuvaring x 3 weeks in August, removed it yesterday.  HPT positive.  No vaginal bleeding.  No pelvic pain.  Urine/BMs normal.  Seen by Neuro for Migraines, on Topamax and Aimovig.  Will stop all Anti-Migraine medications.  Not started on PNVs yet.     OB History  Gravida Para Term Preterm AB Living  3 2 2  0 1 2  SAB TAB Ectopic Multiple Live Births  0 1 0 0 2    # Outcome Date GA Lbr Len/2nd Weight Sex Delivery Anes PTL Lv  3 Term 10/07/16 [redacted]w[redacted]d 02:15 / 00:21 7 lb 9.2 oz (3.436 kg) F Vag-Spont EPI, Local  LIV  2 Term 09/25/12 [redacted]w[redacted]d 05:12 / 00:14 7 lb 6.3 oz (3.354 kg) F Vag-Spont None  LIV     Birth Comments: WNL  1 TAB             Past medical history,surgical history, problem list, medications, allergies, family history and social history were all reviewed and documented in the EPIC chart.   Directed ROS with pertinent positives and negatives documented in the history of present illness/assessment and plan.  Exam:  Vitals:   06/09/20 1438  BP: 140/84   General appearance:  Normal  Abdomen: normal  Gynecologic exam: Vulva normal.  Bimanual exam:  Uterus AV, normal volume, mobile.  No adnexal mass, NT.  Cervix long, closed, firm.  UPT Positive   Assessment/Plan:  29 y.o. 38   1. Amenorrhea HPT positive. - Pregnancy, urine - [id number] OB Transvaginal; Future  2. First trimester pregnancy Last menstrual period April 28, 2020.  Became pregnant while off of NuvaRing for 1 month in July.  5 weeks and 6 days gestation per LMP.  EDD February 02, 2021.  Patient was back on NuvaRing for 3 weeks in August, removed it yesterday.  No vaginal bleeding and no pelvic pain.  Urine pregnancy test confirming pregnancy here today.  Migraines seen by neuro  recently, on Topamax and Aimovig, as well as other medications newly started, will stop all antimigraine medication.  Start prenatal vitamins.  Nutrition and physical activities discussed.  We will follow-up here in 2 weeks to confirm dating with an OB ultrasound.  Will establish OB care with Dr. September at [facility].  [facility]. [facility] delivered her second baby. - Al[facility] OB Transvaginal; Future  Other orders - Prenatal Vit-Fe Fumarate-FA (PRENATAL VITAMIN) 27-0.8 MG TABS; Take 1 tablet by mouth daily.  [place] MD, 3:06 PM 06/09/2020

## 2020-06-29 ENCOUNTER — Encounter: Payer: Self-pay | Admitting: Obstetrics & Gynecology

## 2020-06-29 ENCOUNTER — Other Ambulatory Visit: Payer: Self-pay

## 2020-06-29 ENCOUNTER — Ambulatory Visit (INDEPENDENT_AMBULATORY_CARE_PROVIDER_SITE_OTHER): Payer: BC Managed Care – PPO

## 2020-06-29 ENCOUNTER — Ambulatory Visit (INDEPENDENT_AMBULATORY_CARE_PROVIDER_SITE_OTHER): Payer: BC Managed Care – PPO | Admitting: Obstetrics & Gynecology

## 2020-06-29 DIAGNOSIS — N854 Malposition of uterus: Secondary | ICD-10-CM | POA: Diagnosis not present

## 2020-06-29 DIAGNOSIS — Z3A08 8 weeks gestation of pregnancy: Secondary | ICD-10-CM

## 2020-06-29 DIAGNOSIS — Z3491 Encounter for supervision of normal pregnancy, unspecified, first trimester: Secondary | ICD-10-CM

## 2020-06-29 DIAGNOSIS — N912 Amenorrhea, unspecified: Secondary | ICD-10-CM | POA: Diagnosis not present

## 2020-06-29 DIAGNOSIS — O3680X1 Pregnancy with inconclusive fetal viability, fetus 1: Secondary | ICD-10-CM

## 2020-06-29 DIAGNOSIS — N8311 Corpus luteum cyst of right ovary: Secondary | ICD-10-CM

## 2020-06-29 MED ORDER — PRENATAL VITAMIN 27-0.8 MG PO TABS: 1.0000 | ORAL_TABLET | Freq: Every day | ORAL | 5 refills | Status: DC

## 2020-06-29 NOTE — Progress Notes (Signed)
    Crystal Sheppard 17-Nov-1990 [id number]        29 y.o.  [id number] Married  RP: First trimester pregnancy for Ob US viability/dating  HPI: No vomiting.  No pelvic pain.  No vaginal bleeding.  Patient taking over-the-counter prenatal vitamins, as the prescription I sent did not reach her pharmacy.   OB History  Gravida Para Term Preterm AB Living  3 2 2  0 1 2  SAB TAB Ectopic Multiple Live Births  0 1 0 0 2    # Outcome Date GA Lbr Len/2nd Weight Sex Delivery Anes PTL Lv  3 Term 10/07/16 [redacted]w[redacted]d 02:15 / 00:21 7 lb 9.2 oz (3.436 kg) F Vag-Spont EPI, Local  LIV  2 Term 09/25/12 [redacted]w[redacted]d 05:12 / 00:14 7 lb 6.3 oz (3.354 kg) F Vag-Spont None  LIV     Birth Comments: WNL  1 TAB             Past medical history,surgical history, problem list, medications, allergies, family history and social history were all reviewed and documented in the EPIC chart.   Directed ROS with pertinent positives and negatives documented in the history of present illness/assessment and plan.  Exam:  There were no vitals filed for this visit. General appearance:  Normal  Ob [redacted]w[redacted]d today: T/V images.  Retroverted uterus with a single viable intrauterine pregnancy.  Crown-rump length corresponds with last menstrual period dates.  8 weeks and 6 days gestation.  EDD 02/02/2021.  Fetal heart rate 174 bpm, within normal for gestational age.  Normal yolk sac.  Cervix is long and closed.  Right ovary with a 3.9 x 3.7 corpus luteum cyst.  The cyst walls are thin, the cyst is echo-free and avascular.  Left ovary with a small follicle.  No free fluid in the posterior cul-de-sac.   Assessment/Plan:  29 y.o. 38   1. First trimester pregnancy OB ultrasound reviewed with patient.  Single intrauterine pregnancy viable with fetal heart rate at 174 bpm, gestational age per ultrasound corresponds to last menstrual period at 8 weeks and 6 days gestation.  EDD February 02, 2021.  Prenatal vitamins represcribed.  Patient will organize her  first OB visit with Dr. February 04, 2021 at [facility].  Other orders - Prenatal Vit-Fe Fumarate-FA (PRENATAL VITAMIN) 27-0.8 MG TABS; Take 1 tablet by mouth daily.  [facility] MD, 2:50 PM 06/29/2020

## 2020-07-12 LAB — OB RESULTS CONSOLE GC/CHLAMYDIA
Chlamydia: NEGATIVE
Gonorrhea: NEGATIVE

## 2020-07-12 LAB — OB RESULTS CONSOLE RPR: RPR: NONREACTIVE

## 2020-07-12 LAB — OB RESULTS CONSOLE RUBELLA ANTIBODY, IGM: Rubella: IMMUNE

## 2020-07-12 LAB — OB RESULTS CONSOLE HIV ANTIBODY (ROUTINE TESTING): HIV: NONREACTIVE

## 2020-07-12 LAB — OB RESULTS CONSOLE HEPATITIS B SURFACE ANTIGEN: Hepatitis B Surface Ag: NEGATIVE

## 2020-09-18 ENCOUNTER — Encounter (HOSPITAL_COMMUNITY): Payer: Self-pay

## 2020-09-18 ENCOUNTER — Other Ambulatory Visit: Payer: Self-pay

## 2020-09-18 ENCOUNTER — Inpatient Hospital Stay (HOSPITAL_COMMUNITY)
Admission: AD | Admit: 2020-09-18 | Discharge: 2020-09-18 | Disposition: A | Discharge status: Home / Self Care | Payer: BC Managed Care – PPO | Attending: Obstetrics and Gynecology | Admitting: Obstetrics and Gynecology

## 2020-09-18 DIAGNOSIS — R102 Pelvic and perineal pain: Secondary | ICD-10-CM | POA: Insufficient documentation

## 2020-09-18 DIAGNOSIS — O26892 Other specified pregnancy related conditions, second trimester: Secondary | ICD-10-CM | POA: Insufficient documentation

## 2020-09-18 DIAGNOSIS — Z3A20 20 weeks gestation of pregnancy: Secondary | ICD-10-CM | POA: Insufficient documentation

## 2020-09-18 DIAGNOSIS — Z87891 Personal history of nicotine dependence: Secondary | ICD-10-CM | POA: Insufficient documentation

## 2020-09-18 DIAGNOSIS — M549 Dorsalgia, unspecified: Secondary | ICD-10-CM | POA: Diagnosis not present

## 2020-09-18 DIAGNOSIS — R1031 Right lower quadrant pain: Secondary | ICD-10-CM | POA: Insufficient documentation

## 2020-09-18 DIAGNOSIS — R109 Unspecified abdominal pain: Secondary | ICD-10-CM | POA: Diagnosis not present

## 2020-09-18 HISTORY — DX: Irritable bowel syndrome, unspecified: K58.9

## 2020-09-18 LAB — CBC
HCT: 35.5 % — ABNORMAL LOW (ref 36.0–46.0)
Hemoglobin: 11.5 g/dL — ABNORMAL LOW (ref 12.0–15.0)
MCH: 27.4 pg (ref 26.0–34.0)
MCHC: 32.4 g/dL (ref 30.0–36.0)
MCV: 84.7 fL (ref 80.0–100.0)
Platelets: 218 10*3/uL (ref 150–400)
RBC: 4.19 MIL/uL (ref 3.87–5.11)
RDW: 12.2 % (ref 11.5–15.5)
WBC: 8 10*3/uL (ref 4.0–10.5)
nRBC: 0 % (ref 0.0–0.2)

## 2020-09-18 LAB — URINALYSIS, ROUTINE W REFLEX MICROSCOPIC
Bilirubin Urine: NEGATIVE
Glucose, UA: NEGATIVE mg/dL
Hgb urine dipstick: NEGATIVE
Ketones, ur: NEGATIVE mg/dL
Nitrite: NEGATIVE
Protein, ur: NEGATIVE mg/dL
Specific Gravity, Urine: 1.02 (ref 1.005–1.030)
pH: 8 (ref 5.0–8.0)

## 2020-09-18 LAB — URINALYSIS, MICROSCOPIC (REFLEX)

## 2020-09-18 LAB — WET PREP, GENITAL
Sperm: NONE SEEN
Trich, Wet Prep: NONE SEEN
Yeast Wet Prep HPF POC: NONE SEEN

## 2020-09-18 MED ORDER — CYCLOBENZAPRINE HCL 5 MG PO TABS
10.0000 mg | ORAL_TABLET | Freq: Three times a day (TID) | ORAL | Status: DC | PRN
  Administered 2020-09-18: 10 mg via ORAL
  Filled 2020-09-18: qty 2

## 2020-09-18 MED ORDER — CYCLOBENZAPRINE HCL 10 MG PO TABS: 10.0000 mg | ORAL_TABLET | Freq: Three times a day (TID) | ORAL | 0 refills | Status: DC | PRN

## 2020-09-18 MED ORDER — ACETAMINOPHEN 500 MG PO TABS
1000.0000 mg | ORAL_TABLET | Freq: Four times a day (QID) | ORAL | Status: DC | PRN
  Administered 2020-09-18: 1000 mg via ORAL
  Filled 2020-09-18: qty 2

## 2020-09-18 NOTE — MAU Provider Note (Addendum)
History     CSN: [id number]  Arrival date and time: 09/18/20 1005   First Provider Initiated Contact with Patient 09/18/20 1200      Chief Complaint  Patient presents with  . Abdominal Pain  . Back Pain   29 y.o. [id number] @20 .3 wks presenting with RLQ pain and back pain. Reports onset of sx when she woke this am. Denies any pushing, pulling, or injury but admits to lifting her 29 yr old. Pain in her lower abdomen is RLQ, intermittent, sharp, and rates 6/10. Pain in her back is is constant, achy, low, right sided, and rates 8/10. She has not tried anything for the pain. Denies VB, discharge, itching, or malodor. No urinary sx. No fevers.   OB History    Gravida  4   Para  2   Term  2   Preterm  0   AB  1   Living  2     SAB  0   TAB  1   Ectopic  0   Multiple  0   Live Births  2           Past Medical History:  Diagnosis Date  . Dysmetabolic syndrome X   . Hirsutism   . IBS (irritable bowel syndrome)   . Migraines   . Other specified complication, antepartum(646.83)    headaches  . Polycystic ovarian disease   . Polycystic ovaries     No past surgical history on file.  Family History  Problem Relation Age of Onset  . Asthma Mother   . Hypertension Mother   . Hypertension Brother   . Asthma Brother   . Diabetes Paternal Grandmother   . Mental retardation Cousin        downs    Social History   Tobacco Use  . Smoking status: Former 2  . Smokeless tobacco: Former Games developer  . Vaping Use: Never used  Substance Use Topics  . Alcohol use: Yes    Comment: occasionally  . Drug use: No    Allergies:  Allergies  Allergen Reactions  . Chlorzoxazone Itching    Medications Prior to Admission  Medication Sig Dispense Refill Last Dose  . acetaminophen (TYLENOL) 500 MG chewable tablet Chew 1,000 mg by mouth every 6 (six) hours as needed for pain.   09/17/2020 at Unknown time  . Prenatal Vit-Fe Fumarate-FA (PRENATAL VITAMIN) 27-0.8  MG TABS Take 1 tablet by mouth daily. 90 tablet 5 09/17/2020 at Unknown time  . Erenumab-aooe (AIMOVIG) 70 MG/ML SOAJ Inject into the skin.    at not taking  . rizatriptan (MAXALT) 10 MG tablet Take 10 mg by mouth as needed for migraine. May repeat in 2 hours if needed    at not taking  . topiramate (TOPAMAX) 200 MG tablet Take 200 mg by mouth 2 (two) times daily.    at no taking    Review of Systems  Constitutional: Negative for chills and fever.  Gastrointestinal: Positive for abdominal pain. Negative for constipation, diarrhea, nausea and vomiting.  Genitourinary: Negative for dysuria, hematuria, urgency, vaginal bleeding and vaginal discharge.  Musculoskeletal: Positive for back pain.   Physical Exam   Blood pressure 134/77, pulse 97, temperature 98.4 F (36.9 C), temperature source Oral, resp. rate 18, height 5\' 6"  (1.676 m), weight 105 kg, last menstrual period 04/28/2020, SpO2 100 %. Patient Vitals for the past 24 hrs:  BP Temp Temp src Pulse Resp SpO2 Height Weight  09/18/20 1341 (!)  147/76 -- -- 91 -- -- -- --  09/18/20 1324 (!) 146/79 -- -- 88 16 -- -- --  09/18/20 1154 134/77 -- -- 97 -- -- -- --  09/18/20 1131 136/72 98.4 F (36.9 C) Oral 90 18 100 % -- --  09/18/20 1126 -- -- -- -- -- -- 5\' 6"  (1.676 m) 105 kg  09/18/20 1014 (!) 143/75 98 F (36.7 C) Oral 96 18 99 % -- --    Physical Exam Vitals and nursing note reviewed. Exam conducted with a chaperone present.  Constitutional:      General: She is not in acute distress.    Appearance: Normal appearance.  HENT:     Head: Normocephalic and atraumatic.  Pulmonary:     Effort: Pulmonary effort is normal. No respiratory distress.  Abdominal:     General: There is no distension.     Palpations: Abdomen is soft. There is no mass.     Tenderness: There is no abdominal tenderness. There is no right CVA tenderness, left CVA tenderness, guarding or rebound.     Comments: Fundus @U   Genitourinary:    Comments: VE:  closed/long Musculoskeletal:        General: Normal range of motion.     Cervical back: Normal and normal range of motion.     Thoracic back: Normal.     Lumbar back: Tenderness (Rt) present.  Skin:    General: Skin is warm and dry.  Neurological:     General: No focal deficit present.     Mental Status: She is alert and oriented to person, place, and time.  Psychiatric:        Mood and Affect: Mood normal.        Behavior: Behavior normal.   FHT 154  Results for orders placed or performed during the hospital encounter of 09/18/20 (from the past 24 hour(s))  Urinalysis, Routine w reflex microscopic Urine, Clean Catch     Status: Abnormal   Collection Time: 09/18/20 11:39 AM  Result Value Ref Range   Color, Urine YELLOW YELLOW   APPearance CLOUDY (A) CLEAR   Specific Gravity, Urine 1.020 1.005 - 1.030   pH 8.0 5.0 - 8.0   Glucose, UA NEGATIVE NEGATIVE mg/dL   Hgb urine dipstick NEGATIVE NEGATIVE   Bilirubin Urine NEGATIVE NEGATIVE   Ketones, ur NEGATIVE NEGATIVE mg/dL   Protein, ur NEGATIVE NEGATIVE mg/dL   Nitrite NEGATIVE NEGATIVE   Leukocytes,Ua TRACE (A) NEGATIVE  Urinalysis, Microscopic (reflex)     Status: Abnormal   Collection Time: 09/18/20 11:39 AM  Result Value Ref Range   RBC / HPF 0-5 0 - 5 RBC/hpf   WBC, UA 0-5 0 - 5 WBC/hpf   Bacteria, UA MANY (A) NONE SEEN   Squamous Epithelial / LPF 21-50 0 - 5  Wet prep, genital     Status: Abnormal   Collection Time: 09/18/20 12:01 PM  Result Value Ref Range   Yeast Wet Prep HPF POC NONE SEEN NONE SEEN   Trich, Wet Prep NONE SEEN NONE SEEN   Clue Cells Wet Prep HPF POC PRESENT (A) NONE SEEN   WBC, Wet Prep HPF POC MODERATE (A) NONE SEEN   Sperm NONE SEEN    MAU Course  Procedures Tylenol Flexeril Heating packs  MDM Labs ordered and reviewed. UA with many bacteria but contaminated specimen, pt asymptomatic, will send UC. Pain improved with meds. No signs of PTL, UTI, or acute abd process. Sx consistent with MSK  etiology,  discussed comfort measures. Discharge BP slightly elevated, no hx. Currently has mild HA which she thinks could be migraine starting or because she hasn't eaten today. Recommend f/u in office this week for BP check. Stable for discharge home.    Assessment and Plan   1. [redacted] weeks gestation of pregnancy   2. Pain of round ligament during pregnancy   3. Back pain affecting pregnancy in second trimester    Discharge home Follow up at [facility] this week for BP check Rx Flexeril PTL precautions  Allergies as of 09/18/2020      Reactions   Chlorzoxazone Itching      Medication List    STOP taking these medications   rizatriptan 10 MG tablet Commonly known as: MAXALT   topiramate 200 MG tablet Commonly known as: TOPAMAX     TAKE these medications   acetaminophen 500 MG chewable tablet Commonly known as: TYLENOL Chew 1,000 mg by mouth every 6 (six) hours as needed for pain.   Aimovig 70 MG/ML Soaj Generic drug: Erenumab-aooe Inject into the skin.   cyclobenzaprine 10 MG tablet Commonly known as: FLEXERIL Take 1 tablet (10 mg total) by mouth 3 (three) times daily as needed.   Prenatal Vitamin 27-0.8 MG Tabs Take 1 tablet by mouth daily.      Donette Larry, CNM 09/18/2020, 12:09 PM

## 2020-09-18 NOTE — ED Triage Notes (Signed)
Emergency Medicine Provider OB Triage Evaluation Note  Crystal Sheppard is a 29 y.o. female, [id number], at [redacted]w[redacted]d gestation who presents to the emergency department with complaints of abdominal pain, generalized lower abdomen however worse to the right. Radiates into her lower back.  Began this morning.  Pain intermittent.  No emesis.  No fluid leakage, vaginal bleeding, vaginal discharge.  4/10.  No chest pain, shortness of breath, recent trauma or injury.  Review of  Systems  Positive: abd pain Negative: Nausea, vomiting, paresthesias, weakness, hematuria  Physical Exam  BP (!) 143/75 (BP Location: Right Arm)    Pulse 96    Temp 98 F (36.7 C) (Oral)    Resp 18    LMP 04/28/2020    SpO2 99%  General: Awake, no distress  HEENT: Atraumatic  Resp: Normal effort  Cardiac: Normal rate Abd: Nondistended, non-tender, gravid abdomen MSK: Moves all extremities without difficulty Neuro: Speech clear  Medical Decision Making  Pt evaluated for pregnancy concern and is stable for transfer to MAU. Pt is in agreement with plan for transfer.  10:31 AM Discussed with MAU APP, Shawna Orleans, who accepts patient in transfer.  Clinical Impression   1. Lower abdominal pain        Rayansh Herbst A, PA-C 09/18/20 1031

## 2020-09-18 NOTE — Discharge Instructions (Signed)
Round Ligament Pain  The round ligament is a cord of muscle and tissue that helps support the uterus. It can become a source of pain during pregnancy if it becomes stretched or twisted as the baby grows. The pain usually begins in the second trimester (13-28 weeks) of pregnancy, and it can come and go until the baby is delivered. It is not a serious problem, and it does not cause harm to the baby. Round ligament pain is usually a short, sharp, and pinching pain, but it can also be a dull, lingering, and aching pain. The pain is felt in the lower side of the abdomen or in the groin. It usually starts deep in the groin and moves up to the outside of the hip area. The pain may occur when you:  Suddenly change position, such as quickly going from a sitting to standing position.  Roll over in bed.  Cough or sneeze.  Do physical activity. Follow these instructions at home:   Watch your condition for any changes.  When the pain starts, relax. Then try any of these methods to help with the pain: ? Sitting down. ? Flexing your knees up to your abdomen. ? Lying on your side with one pillow under your abdomen and another pillow between your legs. ? Sitting in a warm bath for 15-20 minutes or until the pain goes away.  Take over-the-counter and prescription medicines only as told by your health care provider.  Move slowly when you sit down or stand up.  Avoid long walks if they cause pain.  Stop or reduce your physical activities if they cause pain.  Keep all follow-up visits as told by your health care provider. This is important. Contact a health care provider if:  Your pain does not go away with treatment.  You feel pain in your back that you did not have before.  Your medicine is not helping. Get help right away if:  You have a fever or chills.  You develop uterine contractions.  You have vaginal bleeding.  You have nausea or vomiting.  You have diarrhea.  You have pain  when you urinate. Summary  Round ligament pain is felt in the lower abdomen or groin. It is usually a short, sharp, and pinching pain. It can also be a dull, lingering, and aching pain.  This pain usually begins in the second trimester (13-28 weeks). It occurs because the uterus is stretching with the growing baby, and it is not harmful to the baby.  You may notice the pain when you suddenly change position, when you cough or sneeze, or during physical activity.  Relaxing, flexing your knees to your abdomen, lying on one side, or taking a warm bath may help to get rid of the pain.  Get help from your health care provider if the pain does not go away or if you have vaginal bleeding, nausea, vomiting, diarrhea, or painful urination. This information is not intended to replace advice given to you by your health care provider. Make sure you discuss any questions you have with your health care provider. Document Revised: 03/25/2018 Document Reviewed: 03/25/2018 Elsevier Patient Education  [place].   Back Pain in Pregnancy Back pain during pregnancy is common. Back pain may be caused by several factors that are related to changes during your pregnancy. Follow these instructions at home: Managing pain, stiffness, and swelling      If directed, for sudden (acute) back pain, put ice on the painful area. ?  Put ice in a plastic bag. ? Place a towel between your skin and the bag. ? Leave the ice on for 20 minutes, 2-3 times per day.  If directed, apply heat to the affected area before you exercise. Use the heat source that your health care provider recommends, such as a moist heat pack or a heating pad. ? Place a towel between your skin and the heat source. ? Leave the heat on for 20-30 minutes. ? Remove the heat if your skin turns bright red. This is especially important if you are unable to feel pain, heat, or cold. You may have a greater risk of getting burned.  If directed,  massage the affected area. Activity  Exercise as told by your health care provider. Gentle exercise is the best way to prevent or manage back pain.  Listen to your body when lifting. If lifting hurts, ask for help or bend your knees. This uses your leg muscles instead of your back muscles.  Squat down when picking up something from the floor. Do not bend over.  Only use bed rest for short periods as told by your health care provider. Bed rest should only be used for the most severe episodes of back pain. Standing, sitting, and lying down  Do not stand in one place for long periods of time.  Use good posture when sitting. Make sure your head rests over your shoulders and is not hanging forward. Use a pillow on your lower back if necessary.  Try sleeping on your side, preferably the left side, with a pregnancy support pillow or 1-2 regular pillows between your legs. ? If you have back pain after a night's rest, your bed may be too soft. ? A firm mattress may provide more support for your back during pregnancy. General instructions  Do not wear high heels.  Eat a healthy diet. Try to gain weight within your health care provider's recommendations.  Use a maternity girdle, elastic sling, or back brace as told by your health care provider.  Take over-the-counter and prescription medicines only as told by your health care provider.  Work with a physical therapist or massage therapist to find ways to manage back pain. Acupuncture or massage therapy may be helpful.  Keep all follow-up visits as told by your health care provider. This is important. Contact a health care provider if:  Your back pain interferes with your daily activities.  You have increasing pain in other parts of your body. Get help right away if:  You develop numbness, tingling, weakness, or problems with the use of your arms or legs.  You develop severe back pain that is not controlled with medicine.  You have a  change in bowel or bladder control.  You develop shortness of breath, dizziness, or you faint.  You develop nausea, vomiting, or sweating.  You have back pain that is a rhythmic, cramping pain similar to labor pains. Labor pain is usually 1-2 minutes apart, lasts for about 1 minute, and involves a bearing down feeling or pressure in your pelvis.  You have back pain and your water breaks or you have vaginal bleeding.  You have back pain or numbness that travels down your leg.  Your back pain developed after you fell.  You develop pain on one side of your back.  You see blood in your urine.  You develop skin blisters in the area of your back pain. Summary  Back pain may be caused by several factors that are  related to changes during your pregnancy.  Follow instructions as told by your health care provider for managing pain, stiffness, and swelling.  Exercise as told by your health care provider. Gentle exercise is the best way to prevent or manage back pain.  Take over-the-counter and prescription medicines only as told by your health care provider.  Keep all follow-up visits as told by your health care provider. This is important. This information is not intended to replace advice given to you by your health care provider. Make sure you discuss any questions you have with your health care provider. Document Revised: 01/26/2019 Document Reviewed: 03/25/2018 Elsevier Patient Education  2020 [employer].

## 2020-09-18 NOTE — ED Triage Notes (Signed)
Pt reports right sided abd pain that radiates to her right flank and back. Denies vaginal bleeding or discharge, states she is about 20 weeks preganant.

## 2020-09-18 NOTE — MAU Note (Signed)
Presents with c/o lower abdominal pain in the RLQ, right sided lateral pain, and lower back pain.  Reports pain began this morning between 0530 and 0600.  Denies VB and LOF.  Last intercourse 09/17/2020.  Hasn't taken any meds for pain.

## 2020-09-19 LAB — CULTURE, OB URINE

## 2020-09-19 LAB — GC/CHLAMYDIA PROBE AMP (~~LOC~~) NOT AT ARMC
Chlamydia: NEGATIVE
Comment: NEGATIVE
Comment: NORMAL
Neisseria Gonorrhea: NEGATIVE

## 2020-10-21 NOTE — L&D Delivery Note (Signed)
Delivery Note At 5:14 PM a viable female was delivered via Vaginal, Spontaneous (Presentation:DOA, nuchal post arm      ).  APGAR: 9, 9; weight  .   Placenta status: Spontaneous, Intact.  Cord: 3 vessels with the following complications: None.  Cord pH: n/a  Anesthesia: Local Episiotomy: None Lacerations: 2nd degree Suture Repair: 3.0 vicryl rapide Est. Blood Loss (mL):  400  Mom to postpartum.  Baby to Couplet care / Skin to Skin.  Lendon Colonel 01/19/2021, 6:02 PM

## 2021-01-02 LAB — OB RESULTS CONSOLE GBS: GBS: NEGATIVE

## 2021-01-17 ENCOUNTER — Other Ambulatory Visit: Payer: Self-pay | Admitting: Advanced Practice Midwife

## 2021-01-18 ENCOUNTER — Other Ambulatory Visit (HOSPITAL_COMMUNITY): Payer: BC Managed Care – PPO | Attending: Obstetrics

## 2021-01-18 ENCOUNTER — Telehealth (HOSPITAL_COMMUNITY): Payer: Self-pay | Admitting: *Deleted

## 2021-01-18 ENCOUNTER — Other Ambulatory Visit: Payer: Self-pay | Admitting: Obstetrics

## 2021-01-18 ENCOUNTER — Encounter (HOSPITAL_COMMUNITY): Payer: Self-pay | Admitting: *Deleted

## 2021-01-18 NOTE — Telephone Encounter (Signed)
Preadmission screen  

## 2021-01-18 NOTE — H&P (Signed)
Crystal Sheppard is a 30 y.o. [id number] at [redacted]w[redacted]d presenting for IOL due to gest htn. Pt notes intermittent contractions. Good fetal movement, No vaginal bleeding, not leaking fluid.  P[facility]are at [facility] since 7 wks - Dated by 7 wk u/s - PCOS, no meds - Anemia, on iron - chronic migraine - IBS - obesity - fetal growth- 35 wks, 6'2, 58% - htn. Pt with bp 144/86 at 36 wks, labs neg, received BM x 2, some improvement in bp in 37th wk but increase now in at 37+ with increase in HA and bp 150/80. For IOL.    Prenatal Transfer Tool  Maternal Diabetes: No Genetic Screening: Normal Maternal Ultrasounds/Referrals: Normal Fetal Ultrasounds or other Referrals:  None Maternal Substance Abuse:  No Significant Maternal Medications:  None Significant Maternal Lab Results: Group B Strep negative     OB History    Gravida  4   Para  2   Term  2   Preterm  0   AB  1   Living  2     SAB  0   IAB  1   Ectopic  0   Multiple  0   Live Births  2          Past Medical History:  Diagnosis Date  . Dysmetabolic syndrome X   . Hirsutism   . IBS (irritable bowel syndrome)   . Migraines   . Other specified complication, antepartum(646.83)    headaches  . Polycystic ovarian disease   . Polycystic ovaries   . Pregnancy induced hypertension    No past surgical history on file. Family History: family history includes Asthma in her brother and mother; Diabetes in her paternal grandmother; Hypertension in her brother and mother; Mental retardation in her cousin. Social History:  reports that she has quit smoking. She has quit using smokeless tobacco. She reports previous alcohol use. She reports that she does not use drugs.  Review of Systems - Negative except increase in HA, unsure if due to chronic migraine     Last menstrual period 04/28/2020.  Physical Exam:   Vitals:   01/19/21 1134 01/19/21 1204  BP: 139/76 (!) 144/85  Pulse: 85 91  Resp: 18 18  Temp:    SpO2:        Prenatal labs: ABO, Rh:  A+ Antibody:  neg Rubella:  immune RPR:   NP HBsAg:   neg HIV:   neg GBS:   neg 1 hr Glucola 109  Genetic screening nl NT, nl AFP Anatomy US normal.    Assessment/Plan: 30 y.o. [id number] at [redacted]w[redacted]d -gest htn. Check labs on admit, follow bps. Defer Mag unless severe range bps. Plan IOL, start with pit 2x2, AROM when able - GBS neg   Lendon Colonel 01/18/2021, 2:29 PM  Lendon Colonel 01/19/2021 12:41 PM

## 2021-01-19 ENCOUNTER — Other Ambulatory Visit: Payer: Self-pay

## 2021-01-19 ENCOUNTER — Encounter (HOSPITAL_COMMUNITY): Payer: Self-pay | Admitting: Obstetrics

## 2021-01-19 ENCOUNTER — Inpatient Hospital Stay (HOSPITAL_COMMUNITY): Payer: BC Managed Care – PPO

## 2021-01-19 ENCOUNTER — Inpatient Hospital Stay (HOSPITAL_COMMUNITY)
Admission: AD | Admit: 2021-01-19 | Discharge: 2021-01-21 | DRG: 807 | Disposition: A | Discharge status: Home / Self Care | Payer: BC Managed Care – PPO | Attending: Obstetrics | Admitting: Obstetrics

## 2021-01-19 DIAGNOSIS — Z3490 Encounter for supervision of normal pregnancy, unspecified, unspecified trimester: Secondary | ICD-10-CM | POA: Diagnosis present

## 2021-01-19 DIAGNOSIS — Z87891 Personal history of nicotine dependence: Secondary | ICD-10-CM | POA: Diagnosis not present

## 2021-01-19 DIAGNOSIS — Z3A38 38 weeks gestation of pregnancy: Secondary | ICD-10-CM | POA: Diagnosis not present

## 2021-01-19 DIAGNOSIS — O9902 Anemia complicating childbirth: Secondary | ICD-10-CM | POA: Diagnosis present

## 2021-01-19 DIAGNOSIS — Z20822 Contact with and (suspected) exposure to covid-19: Secondary | ICD-10-CM | POA: Diagnosis present

## 2021-01-19 DIAGNOSIS — Z23 Encounter for immunization: Secondary | ICD-10-CM | POA: Diagnosis not present

## 2021-01-19 DIAGNOSIS — O134 Gestational [pregnancy-induced] hypertension without significant proteinuria, complicating childbirth: Secondary | ICD-10-CM | POA: Diagnosis present

## 2021-01-19 DIAGNOSIS — O99214 Obesity complicating childbirth: Secondary | ICD-10-CM | POA: Diagnosis present

## 2021-01-19 DIAGNOSIS — O139 Gestational [pregnancy-induced] hypertension without significant proteinuria, unspecified trimester: Secondary | ICD-10-CM | POA: Diagnosis present

## 2021-01-19 LAB — CBC
HCT: 36.8 % (ref 36.0–46.0)
HCT: 37.6 % (ref 36.0–46.0)
Hemoglobin: 12 g/dL (ref 12.0–15.0)
Hemoglobin: 11.9 g/dL — ABNORMAL LOW (ref 12.0–15.0)
MCH: 27.8 pg (ref 26.0–34.0)
MCH: 27.3 pg (ref 26.0–34.0)
MCHC: 32.6 g/dL (ref 30.0–36.0)
MCHC: 31.6 g/dL (ref 30.0–36.0)
MCV: 85.2 fL (ref 80.0–100.0)
MCV: 86.2 fL (ref 80.0–100.0)
Platelets: 214 10*3/uL (ref 150–400)
Platelets: 188 10*3/uL (ref 150–400)
RBC: 4.32 MIL/uL (ref 3.87–5.11)
RBC: 4.36 MIL/uL (ref 3.87–5.11)
RDW: 13.2 % (ref 11.5–15.5)
RDW: 13.2 % (ref 11.5–15.5)
WBC: 6.6 10*3/uL (ref 4.0–10.5)
WBC: 7.6 10*3/uL (ref 4.0–10.5)
nRBC: 0 % (ref 0.0–0.2)
nRBC: 0 % (ref 0.0–0.2)

## 2021-01-19 LAB — TYPE AND SCREEN
ABO/RH(D): A POS
Antibody Screen: NEGATIVE

## 2021-01-19 LAB — RESP PANEL BY RT-PCR (FLU A&B, COVID) ARPGX2
Influenza A by PCR: NEGATIVE
Influenza B by PCR: NEGATIVE
SARS Coronavirus 2 by RT PCR: NEGATIVE

## 2021-01-19 LAB — RPR: RPR Ser Ql: NONREACTIVE

## 2021-01-19 MED ORDER — LACTATED RINGERS IV SOLN: INTRAVENOUS | Status: DC

## 2021-01-19 MED ORDER — IBUPROFEN 600 MG PO TABS
600.0000 mg | ORAL_TABLET | Freq: Four times a day (QID) | ORAL | Status: DC
  Administered 2021-01-19 – 2021-01-21 (×7): 600 mg via ORAL
  Filled 2021-01-19 (×7): qty 1

## 2021-01-19 MED ORDER — ZOLPIDEM TARTRATE 5 MG PO TABS
5.0000 mg | ORAL_TABLET | Freq: Every evening | ORAL | Status: DC | PRN
Start: 2021-01-19 — End: 2021-01-21

## 2021-01-19 MED ORDER — LACTATED RINGERS IV SOLN
500.0000 mL | INTRAVENOUS | Status: DC | PRN
  Administered 2021-01-19: 500 mL via INTRAVENOUS

## 2021-01-19 MED ORDER — LIDOCAINE HCL (PF) 1 % IJ SOLN
30.0000 mL | INTRAMUSCULAR | Status: AC | PRN
  Administered 2021-01-19: 30 mL via SUBCUTANEOUS
  Filled 2021-01-19: qty 30

## 2021-01-19 MED ORDER — SENNOSIDES-DOCUSATE SODIUM 8.6-50 MG PO TABS
2.0000 | ORAL_TABLET | ORAL | Status: DC
  Administered 2021-01-20 – 2021-01-21 (×2): 2 via ORAL
  Filled 2021-01-19 (×2): qty 2

## 2021-01-19 MED ORDER — OXYCODONE HCL 5 MG PO TABS: 10.0000 mg | ORAL_TABLET | ORAL | Status: DC | PRN

## 2021-01-19 MED ORDER — WITCH HAZEL-GLYCERIN EX PADS: 1.0000 "application " | MEDICATED_PAD | CUTANEOUS | Status: DC | PRN

## 2021-01-19 MED ORDER — BENZOCAINE-MENTHOL 20-0.5 % EX AERO
1.0000 "application " | INHALATION_SPRAY | CUTANEOUS | Status: DC | PRN
  Administered 2021-01-19: 1 via TOPICAL
  Filled 2021-01-19: qty 56

## 2021-01-19 MED ORDER — COCONUT OIL OIL
1.0000 "application " | TOPICAL_OIL | Status: DC | PRN
  Administered 2021-01-20: 1 via TOPICAL

## 2021-01-19 MED ORDER — OXYTOCIN-SODIUM CHLORIDE 30-0.9 UT/500ML-% IV SOLN: 2.5000 [IU]/h | INTRAVENOUS | Status: DC

## 2021-01-19 MED ORDER — TERBUTALINE SULFATE 1 MG/ML IJ SOLN: 0.2500 mg | Freq: Once | INTRAMUSCULAR | Status: DC | PRN

## 2021-01-19 MED ORDER — OXYTOCIN BOLUS FROM INFUSION
333.0000 mL | Freq: Once | INTRAVENOUS | Status: AC
  Administered 2021-01-19: 333 mL via INTRAVENOUS

## 2021-01-19 MED ORDER — TETANUS-DIPHTH-ACELL PERTUSSIS 5-2.5-18.5 LF-MCG/0.5 IM SUSY
0.5000 mL | PREFILLED_SYRINGE | Freq: Once | INTRAMUSCULAR | Status: AC
  Administered 2021-01-21: 0.5 mL via INTRAMUSCULAR
  Filled 2021-01-19: qty 0.5

## 2021-01-19 MED ORDER — SIMETHICONE 80 MG PO CHEW
80.0000 mg | CHEWABLE_TABLET | ORAL | Status: DC | PRN
Start: 2021-01-19 — End: 2021-01-21

## 2021-01-19 MED ORDER — OXYTOCIN-SODIUM CHLORIDE 30-0.9 UT/500ML-% IV SOLN
1.0000 m[IU]/min | INTRAVENOUS | Status: DC
  Administered 2021-01-19: 2 m[IU]/min via INTRAVENOUS
  Filled 2021-01-19: qty 500

## 2021-01-19 MED ORDER — DIBUCAINE (PERIANAL) 1 % EX OINT: 1.0000 "application " | TOPICAL_OINTMENT | CUTANEOUS | Status: DC | PRN

## 2021-01-19 MED ORDER — SOD CITRATE-CITRIC ACID 500-334 MG/5ML PO SOLN: 30.0000 mL | ORAL | Status: DC | PRN

## 2021-01-19 MED ORDER — PRENATAL MULTIVITAMIN CH
1.0000 | ORAL_TABLET | Freq: Every day | ORAL | Status: DC
  Administered 2021-01-20 – 2021-01-21 (×2): 1 via ORAL
  Filled 2021-01-19 (×2): qty 1

## 2021-01-19 MED ORDER — OXYCODONE HCL 5 MG PO TABS
5.0000 mg | ORAL_TABLET | ORAL | Status: DC | PRN
Start: 2021-01-19 — End: 2021-01-21

## 2021-01-19 MED ORDER — ONDANSETRON HCL 4 MG/2ML IJ SOLN: 4.0000 mg | Freq: Four times a day (QID) | INTRAMUSCULAR | Status: DC | PRN

## 2021-01-19 MED ORDER — ONDANSETRON HCL 4 MG PO TABS: 4.0000 mg | ORAL_TABLET | ORAL | Status: DC | PRN

## 2021-01-19 MED ORDER — ONDANSETRON HCL 4 MG/2ML IJ SOLN: 4.0000 mg | INTRAMUSCULAR | Status: DC | PRN

## 2021-01-19 MED ORDER — PHENYLEPHRINE 40 MCG/ML (10ML) SYRINGE FOR IV PUSH (FOR BLOOD PRESSURE SUPPORT)
PREFILLED_SYRINGE | INTRAVENOUS | Status: AC
  Filled 2021-01-19: qty 10

## 2021-01-19 MED ORDER — ACETAMINOPHEN 325 MG PO TABS
650.0000 mg | ORAL_TABLET | ORAL | Status: DC | PRN
  Administered 2021-01-19: 650 mg via ORAL
  Filled 2021-01-19: qty 2

## 2021-01-19 MED ORDER — FENTANYL-BUPIVACAINE-NACL 0.5-0.125-0.9 MG/250ML-% EP SOLN
EPIDURAL | Status: AC
  Filled 2021-01-19: qty 250

## 2021-01-19 MED ORDER — DIPHENHYDRAMINE HCL 25 MG PO CAPS
25.0000 mg | ORAL_CAPSULE | Freq: Four times a day (QID) | ORAL | Status: DC | PRN
Start: 2021-01-19 — End: 2021-01-21

## 2021-01-19 MED ORDER — ACETAMINOPHEN 325 MG PO TABS
650.0000 mg | ORAL_TABLET | ORAL | Status: DC | PRN
  Administered 2021-01-19 – 2021-01-21 (×3): 650 mg via ORAL
  Filled 2021-01-19 (×3): qty 2

## 2021-01-19 NOTE — Progress Notes (Signed)
S: Doing well, no complaints, pain well controlled, not needing epidural at this time  O: BP (!) 141/80   Pulse 88   Temp 98 F (36.7 C) (Oral)   Resp 18   Ht 5\' 6"  (1.676 m)   Wt 114.3 kg   LMP 04/28/2020   SpO2 99%   BMI 40.67 kg/m    FHT:  FHR: 130s bpm, variability: moderate,  accelerations:  Present,  decelerations:  Absent UC:   regular, every 3 minutes SVE:   Dilation: 4 Effacement (%): 80 Station: -2 Exam by:: DR [email or web address]  AROM clear  A / P:  30 y.o.  OB History  Gravida Para Term Preterm AB Living  4 2 2  0 1 2  SAB IAB Ectopic Multiple Live Births  0 1 0 0 2   at [redacted]w[redacted]d IOL, gest htn. borderline bps, no severe range bps or features. Plan IOL. slow IOL with pit, expect more rapid change now s/p AROM  Fetal Wellbeing:  Category I Pain Control:  Labor support without medications  Anticipated MOD:  NSVD  01/19/2021, 3:48 PM

## 2021-01-19 NOTE — Plan of Care (Signed)
  Problem: Education: Goal: Knowledge of Childbirth will improve Outcome: Completed/Met Goal: Ability to make informed decisions regarding treatment and plan of care will improve Outcome: Completed/Met Goal: Ability to state and carry out methods to decrease the pain will improve Outcome: Completed/Met Goal: Individualized Educational Video(s) Outcome: Completed/Met   Problem: Coping: Goal: Ability to verbalize concerns and feelings about labor and delivery will improve Outcome: Completed/Met   Problem: Life Cycle: Goal: Ability to make normal progression through stages of labor will improve Outcome: Completed/Met Goal: Ability to effectively push during vaginal delivery will improve Outcome: Completed/Met   Problem: Role Relationship: Goal: Will demonstrate positive interactions with the child Outcome: Completed/Met   Problem: Safety: Goal: Risk of complications during labor and delivery will decrease Outcome: Completed/Met   Problem: Pain Management: Goal: Relief or control of pain from uterine contractions will improve Outcome: Completed/Met   Problem: Education: Goal: Knowledge of General Education information will improve Description: Including pain rating scale, medication(s)/side effects and non-pharmacologic comfort measures Outcome: Completed/Met   Problem: Health Behavior/Discharge Planning: Goal: Ability to manage health-related needs will improve Outcome: Completed/Met   Problem: Clinical Measurements: Goal: Ability to maintain clinical measurements within normal limits will improve Outcome: Completed/Met Goal: Will remain free from infection Outcome: Completed/Met Goal: Diagnostic test results will improve Outcome: Completed/Met Goal: Respiratory complications will improve Outcome: Completed/Met Goal: Cardiovascular complication will be avoided Outcome: Completed/Met   Problem: Activity: Goal: Risk for activity intolerance will decrease Outcome:  Completed/Met   Problem: Nutrition: Goal: Adequate nutrition will be maintained Outcome: Completed/Met   Problem: Coping: Goal: Level of anxiety will decrease Outcome: Completed/Met   Problem: Elimination: Goal: Will not experience complications related to bowel motility Outcome: Completed/Met Goal: Will not experience complications related to urinary retention Outcome: Completed/Met   Problem: Pain Managment: Goal: General experience of comfort will improve Outcome: Completed/Met   Problem: Safety: Goal: Ability to remain free from injury will improve Outcome: Completed/Met   Problem: Skin Integrity: Goal: Risk for impaired skin integrity will decrease Outcome: Completed/Met   Problem: Education: Goal: Knowledge of condition will improve Outcome: Completed/Met Goal: Individualized Educational Video(s) Outcome: Completed/Met Goal: Individualized Newborn Educational Video(s) Outcome: Completed/Met   Problem: Activity: Goal: Will verbalize the importance of balancing activity with adequate rest periods Outcome: Completed/Met Goal: Ability to tolerate increased activity will improve Outcome: Completed/Met   Problem: Coping: Goal: Ability to identify and utilize available resources and services will improve Outcome: Completed/Met   Problem: Life Cycle: Goal: Chance of risk for complications during the postpartum period will decrease Outcome: Completed/Met   Problem: Role Relationship: Goal: Ability to demonstrate positive interaction with newborn will improve Outcome: Completed/Met   Problem: Skin Integrity: Goal: Demonstration of wound healing without infection will improve Outcome: Completed/Met

## 2021-01-19 NOTE — Progress Notes (Signed)
S: Doing well, no complaints, pain well controlled and unsure if planning epidural  O: BP (!) 147/87   Pulse 85   Temp 98.1 F (36.7 C) (Oral)   Resp 18   Ht 5\' 6"  (1.676 m)   Wt 114.3 kg   LMP 04/28/2020   SpO2 99%   BMI 40.67 kg/m    FHT:  FHR: 140s bpm, variability: moderate,  accelerations:  Present,  decelerations:  Absent UC:   regular, every 3 minutes SVE:   Dilation: 4 Effacement (%): 80 Station: -2 Exam by:: Dr [email or web address]   A / P:  30 y.o.  OB History  Gravida Para Term Preterm AB Living  4 2 2  0 1 2  SAB IAB Ectopic Multiple Live Births  0 1 0 0 2   at [redacted]w[redacted]d IOL gest htn, slow progress with pitocin alone, expect more active change with AROM  Gest htn, bps and sx stable.   Fetal Wellbeing:  Category I Pain Control:  Labor support without medications  Anticipated MOD:  NSVD  01/19/2021, 1:43 PM

## 2021-01-19 NOTE — Lactation Note (Signed)
This note was copied from a baby's chart. Lactation Consultation Note This is an experienced mom who bf her last baby for 3 mo. Baby was wrapped in blanket and sleeping when LC entered the room. Mom stated that baby bf 2x since delivery. She was pleased with the ease of bf him. LC asked if she could place baby back sts with mom. Mom agreed. Provided brief ed and left mom and baby sts.  Patient Name: Crystal Sheppard [id number] Date: 01/19/2021 Reason for consult: L&D Initial assessment Age:86 hours    Consult Status Consult Status: Follow-up Follow-up type: In-patient   Elder Negus, MA IBCLC 01/19/2021, 6:40 PM

## 2021-01-19 NOTE — Lactation Note (Signed)
This note was copied from a baby's chart. Lactation Consultation Note  Patient Name: Crystal Sheppard Date: 01/19/2021 Reason for consult: Initial assessment;Early term 37-38.6wks;Maternal endocrine disorder;Other (Comment) (GHTN) Age:32 hours P3, ETI female infant. Infant had two void diapers since birth. Per mom, she feels Breastfeeding is going well,  she feels only a tug when infant latches at the breast and no pain. Per mom, Infant has breastfeed 5 times since delivery, mom is latching infant at breast according to  Infant feeding cues.  LC did not observe latch,  due to  infant recently BF for 30 minutes prior to [facility] entering the room.  Mom doesn't have any questions or concerns for LC at this time. Mom knows to BF infant according to cues, 8 to 12 or more times within 24 hours, STS. LC discussed infant's input and output. Mom knows to call RN or LC if she has questions, concerns or needs assistance with latching infant at the breast.  Mom made aware of O/P services, breastfeeding support groups, community resources, and our phone # for post-discharge questions.   Maternal Data Has patient been taught Hand Expression?: Yes Does the patient have breastfeeding experience prior to this delivery?: Yes How long did the patient breastfeed?: Per mom, she BF 1st child for one month and 2nd child who is 4 years for 3 months.  Feeding Mother's Current Feeding Choice: Breast Milk  LATCH Score                    Lactation Tools Discussed/Used    Interventions Interventions: Breast feeding basics reviewed;Skin to skin;Hand express;Position options;DEBP  Discharge Pump: Personal WIC Program: No  Consult Status Consult Status: Follow-up Date: 01/20/21 Follow-up type: In-patient    Danelle Earthly 01/19/2021, 10:59 PM

## 2021-01-20 MED ORDER — NIFEDIPINE ER OSMOTIC RELEASE 30 MG PO TB24
30.0000 mg | ORAL_TABLET | Freq: Every day | ORAL | Status: DC
  Administered 2021-01-20: 30 mg via ORAL
  Filled 2021-01-20: qty 1

## 2021-01-20 NOTE — Lactation Note (Signed)
This note was copied from a baby's chart. Lactation Consultation Note  Patient Name: Crystal Sheppard [id number] Date: 01/20/2021 Reason for consult: Follow-up assessment;Early term 37-38.6wks;Maternal endocrine disorder;Infant weight loss Age:32 hours  Visited with mom of 20 hours old ETI female, she's a P3 and experienced BF. Mom and baby might be going home today, baby is at 3% weight loss. Reviewed discharge instructions, prevention/treatment of sore nipples, mom requested coconut oil, RN Randa Evens will bring it to her room but she's aware that her EBM will be the best remedy to prevent/treat sore nipples.  Offered assistance with latch but mom told LC baby just finished a feeding. Noticed that there is no LATCH score on this chart, and even though mom is experienced and states that BF is going well and feedings at the breast are comfortable, she was asked to call for feeding assistance when needed.   Per mom baby has already started cluster feeding, reviewed normal newborn behavior, feeding cues, cluster feeding, size of baby's stomach and lactogenesis II.  Feeding plan:  1. Encouraged mom to continue feed baby STS 8-12 times/24 hours or sooner if feeding cues are present 2. Hand expression and breast massage were also encouraged prior pumping  No support person in mom's room at the time of [facility] consultation. She reported all questions and concerns were answered, she's aware of LC OP services and will call PRN.   Maternal Data    Feeding Mother's Current Feeding Choice: Breast Milk  LATCH Score    Lactation Tools Discussed/Used Tools: Coconut oil  Interventions Interventions: Breast feeding basics reviewed;Coconut oil  Discharge Discharge Education: Engorgement and breast care;Warning signs for feeding baby  Consult Status Consult Status: PRN Follow-up type: In-patient    Kannon Granderson Venetia Constable 01/20/2021, 2:08 PM

## 2021-01-21 MED ORDER — IBUPROFEN 600 MG PO TABS: 600.0000 mg | ORAL_TABLET | Freq: Four times a day (QID) | ORAL | 0 refills | Status: AC

## 2021-01-21 MED ORDER — NIFEDIPINE ER 30 MG PO TB24: 30.0000 mg | ORAL_TABLET | Freq: Every day | ORAL | 1 refills | Status: AC

## 2021-01-21 NOTE — Progress Notes (Signed)
Patient requested enfamil for baby yesterday late afternoon because baby was still fussy after breastfeeding every hour. She did not want donor breast milk. Mom was told to breastfeed and supplement with enfamil.

## 2021-01-21 NOTE — Discharge Summary (Signed)
OB Discharge Summary  Patient Name: Crystal Sheppard DOB: 1991/03/25 MRN: [id number]  Date of admission: 01/19/2021 Delivering MD: Noland Fordyce   Date of discharge: 01/21/2021  Admitting diagnosis: Encounter for induction of labor [Z34.90] Intrauterine pregnancy: [redacted]w[redacted]d     Secondary diagnosis:Principal Problem:   Postpartum care following vaginal delivery 4/1 Active Problems:   Encounter for induction of labor   SVD 4/1   Gestational hypertension   Second degree perineal laceration  Additional problems: Gestational hypertension     Discharge diagnosis: Term Pregnancy Delivered and Gestational Hypertension                                                                     Post partum procedures:None  Augmentation: AROM and Pitocin  Complications: None  Hospital course:  Induction of Labor With Vaginal Delivery   30 y.o. yo [phone] at [redacted]w[redacted]d was admitted to the hospital 01/19/2021 for induction of labor.  Indication for induction: Gestational hypertension.  Patient had an uncomplicated labor course as follows: Membrane Rupture Time/Date: 3:43 PM ,01/19/2021   Delivery Method:Vaginal, Spontaneous  Episiotomy: None  Lacerations:  2nd degree  Details of delivery can be found in separate delivery note.  Patient had a routine postpartum course. Patient is discharged home 01/21/21.  On postpartum day #1 blood pressures remain mildly elevated and patient was started on Procardia 30 mg XL once daily.  Blood pressure subsequently under good control.  On postpartum day 2 patient reports no headache no vision change no right upper quadrant pain.  Exam was unremarkable.  Newborn Data: Birth date:01/19/2021  Birth time:5:14 PM  Gender:Female  Living status:Living  Apgars:9 ,9  Weight:3402 g   Physical exam  Vitals:   01/20/21 1404 01/20/21 1557 01/20/21 2141 01/21/21 0530  BP: 139/83 (!) 148/87 136/83 108/64  Pulse: 85 93 99 80  Resp: 17 17 18 18   Temp: 98 F (36.7 C) 98.4 F  (36.9 C) 98.3 F (36.8 C) 98 F (36.7 C)  TempSrc: Oral Oral Oral Oral  SpO2: 100% 99% 99% 98%  Weight:      Height:       General: alert, cooperative and no distress Lochia: appropriate Uterine Fundus: firm Incision: N/A DVT Evaluation: No evidence of DVT seen on physical exam. Labs: Lab Results  Component Value Date   WBC 7.6 01/19/2021   HGB 11.9 (L) 01/19/2021   HCT 37.6 01/19/2021   MCV 86.2 01/19/2021   PLT 188 01/19/2021   CMP Latest Ref Rng & Units 10/19/2018  Glucose 70 - 99 mg/dL 10/21/2018)  BUN 6 - 20 mg/dL 13  Creatinine 465(K - 3.54 mg/dL 6.56  Sodium 8.12 - 751 mmol/L 135  Potassium 3.5 - 5.1 mmol/L 3.6  Chloride 98 - 111 mmol/L 106  CO2 22 - 32 mmol/L 22  Calcium 8.9 - 10.3 mg/dL 700)  Total Protein 6.5 - 8.1 g/dL 6.6  Total Bilirubin 0.3 - 1.2 mg/dL 1.7(C)  Alkaline Phos 38 - 126 U/L 50  AST 15 - 41 U/L 15  ALT 0 - 44 U/L 17    Discharge instruction: per After Visit Summary and "Baby and Me Booklet".  After Visit Meds:  Allergies as of 01/21/2021  Reactions   Chlorzoxazone Itching, Rash      Medication List    STOP taking these medications   cyclobenzaprine 10 MG tablet Commonly known as: FLEXERIL     TAKE these medications   acetaminophen 500 MG tablet Commonly known as: TYLENOL Take 1,000 mg by mouth every 6 (six) hours as needed for mild pain or headache.   ferrous sulfate 325 (65 FE) MG tablet Take 325 mg by mouth daily with breakfast.   ibuprofen 600 MG tablet Commonly known as: ADVIL Take 1 tablet (600 mg total) by mouth every 6 (six) hours.   magnesium oxide 400 MG tablet Commonly known as: MAG-OX Take 400 mg by mouth daily.   NIFEdipine 30 MG 24 hr tablet Commonly known as: ADALAT CC Take 1 tablet (30 mg total) by mouth daily.   Prenatal Vitamin 27-0.8 MG Tabs Take 1 tablet by mouth daily.       Diet: routine diet  Activity: Advance as tolerated. Pelvic rest for 6 weeks.   Outpatient follow up:1 week Follow up  Appt: Future Appointments  Date Time Provider Department Center  05/21/2021  4:00 PM Genia Del, MD [facility] None   Follow up visit: No follow-ups on file.  Postpartum contraception: Not Discussed  Newborn Data: Live born female  Birth Weight: 7 lb 8 oz (3402 g) APGAR: 9, 9  Newborn Delivery   Birth date/time: 01/19/2021 17:14:00 Delivery type: Vaginal, Spontaneous      Baby Feeding: Breast Disposition:home with mother   01/21/2021 Lendon Colonel, MD

## 2021-05-21 ENCOUNTER — Ambulatory Visit: Payer: BC Managed Care – PPO | Admitting: Obstetrics & Gynecology

## 2023-05-20 ENCOUNTER — Ambulatory Visit
Admission: EM | Admit: 2023-05-20 | Discharge: 2023-05-20 | Disposition: A | Discharge status: Home / Self Care | Payer: BC Managed Care – PPO

## 2023-05-20 ENCOUNTER — Ambulatory Visit (INDEPENDENT_AMBULATORY_CARE_PROVIDER_SITE_OTHER): Payer: BC Managed Care – PPO

## 2023-05-20 DIAGNOSIS — Z8719 Personal history of other diseases of the digestive system: Secondary | ICD-10-CM | POA: Diagnosis not present

## 2023-05-20 DIAGNOSIS — R1030 Lower abdominal pain, unspecified: Secondary | ICD-10-CM | POA: Diagnosis not present

## 2023-05-20 LAB — POCT URINALYSIS DIP (MANUAL ENTRY)
Bilirubin, UA: NEGATIVE
Glucose, UA: NEGATIVE mg/dL
Ketones, POC UA: NEGATIVE mg/dL
Leukocytes, UA: NEGATIVE
Nitrite, UA: NEGATIVE
Protein Ur, POC: NEGATIVE mg/dL
Spec Grav, UA: <=1.005 — AB (ref 1.010–1.025)
Urobilinogen, UA: 0.2 E.U./dL
pH, UA: 6 (ref 5.0–8.0)

## 2023-05-20 LAB — POCT URINE PREGNANCY: Preg Test, Ur: NEGATIVE

## 2023-05-20 NOTE — ED Provider Notes (Signed)
[facility]    CSN: [id number] Arrival date & time: 05/20/23  1301    HISTORY   Chief Complaint  Patient presents with   Abdominal Pain   Back Pain   HPI Crystal Sheppard is a pleasant, 32 y.o. female who presents to urgent care today. Pt c/o onset of sharp pain to lower back and across lower abd that began this AM. Reports feeling need to have BM but was unable, states she did have a BM last evening. Pt reports hx of IBS with mixed constipation and diarrhea, states her GI prescribed Linzess for her episodes of constipation but her insurance will not pay for it. Denies any dysuria, but does endorse some urinary pressure, urine dip today is unremarkable, pt is not pregnant. Denies n/v, fever, body ache, chills.    The history is provided by the patient.   Past Medical History:  Diagnosis Date   Dysmetabolic syndrome X    Hirsutism    IBS (irritable bowel syndrome)    Migraines    Other specified complication, antepartum(646.83)    headaches   Polycystic ovarian disease    Polycystic ovaries    Pregnancy induced hypertension    Patient Active Problem List   Diagnosis Date Noted   Encounter for induction of labor 01/19/2021   SVD 4/1 01/19/2021   Postpartum care following vaginal delivery 4/1 01/19/2021   Gestational hypertension 01/19/2021   Second degree perineal laceration 01/19/2021   History reviewed. No pertinent surgical history. OB History     Gravida  4   Para  3   Term  3   Preterm  0   AB  1   Living  3      SAB  0   IAB  1   Ectopic  0   Multiple  0   Live Births  3          Home Medications    Prior to Admission medications   Medication Sig Start Date End Date Taking? Authorizing Provider  colestipol (COLESTID) 1 g tablet TAKE 1 TABLET BY MOUTH EVERY EVENING. TAKE 1 HOUR BEFORE OTHER MEDS OR TAKE 4 HOURS AFTER OTHER MEDS 05/22/22  Yes [provider]  dicyclomine (BENTYL) 10 MG capsule TAKE 1 CAPSULE BY MOUTH 30  (THIRTY) MINUTES BEFORE MEALS. 05/22/22  Yes [provider]  linaclotide (LINZESS) 290 MCG CAPS capsule Take by mouth. 11/26/22 11/26/23 Yes [provider]  metFORMIN (GLUCOPHAGE) 500 MG tablet Take by mouth. 06/06/22  Yes [provider]  NURTEC 75 MG TBDP Take by mouth. 02/18/23  Yes [provider]  rizatriptan (MAXALT) 10 MG tablet TAKE ONE TABLET ONCE AS NEEDED FOR MIGRAINE FOR UP TO 1 DOSE. MAY REPEAT IN 2 HOURS IF NEEDED. MAX 2/24 HOURS 03/06/22  Yes [provider]  acetaminophen (TYLENOL) 500 MG tablet Take 1,000 mg by mouth every 6 (six) hours as needed for mild pain or headache.    [provider]  baclofen (LIORESAL) 10 MG tablet Take 10 mg by mouth 3 (three) times daily as needed.    [provider]  ferrous sulfate 325 (65 FE) MG tablet Take 325 mg by mouth daily with breakfast.    [provider]  ibuprofen (ADVIL) 600 MG tablet Take 1 tablet (600 mg total) by mouth every 6 (six) hours. 01/21/21   Noland Fordyce, MD  magnesium oxide (MAG-OX) 400 MG tablet Take 400 mg by mouth daily.    [provider]  NIFEdipine (ADALAT CC) 30 MG 24 hr tablet Take 1 tablet (30 mg total) by mouth daily. 01/21/21   Noland Fordyce, MD  Prenatal Vit-Fe Fumarate-FA (PRENATAL VITAMIN) 27-0.8 MG TABS Take 1 tablet by mouth daily. 06/29/20   Genia Del, MD  topiramate (TOPAMAX) 25 MG tablet Take 25 mg by mouth daily.    [provider]    Family History Family History  Problem Relation Age of Onset   Asthma Mother    Hypertension Mother    Hypertension Brother    Asthma Brother    Diabetes Paternal Grandmother    Mental retardation Cousin        downs   Social History Social History   Tobacco Use   Smoking status: Former   Smokeless tobacco: Former  Building services engineer status: Never Used  Substance Use Topics   Alcohol use: Not Currently    Comment: occasionally   Drug use: No   Allergies    Chlorzoxazone  Review of Systems Review of Systems Pertinent findings revealed after performing a 14 point review of systems has been noted in the history of present illness.  Physical Exam Vital Signs BP (!) 160/98 (BP Location: Right Arm)   Pulse 91   Temp 98.2 F (36.8 C) (Oral)   Resp 18   LMP 04/28/2023 (Approximate)   SpO2 98%   No data found.  Physical Exam Vitals and nursing note reviewed.  Constitutional:      General: She is not in acute distress.    Appearance: Normal appearance.  HENT:     Head: Normocephalic and atraumatic.  Eyes:     Pupils: Pupils are equal, round, and reactive to light.  Cardiovascular:     Rate and Rhythm: Normal rate and regular rhythm.  Pulmonary:     Effort: Pulmonary effort is normal.     Breath sounds: Normal breath sounds.  Abdominal:     General: Abdomen is flat. Bowel sounds are decreased.     Tenderness: There is abdominal tenderness in the periumbilical area. There is no right CVA tenderness or left CVA tenderness.     Comments: Abdomen is firm to palpation   Musculoskeletal:        General: Normal range of motion.     Cervical back: Normal range of motion and neck supple.  Skin:    General: Skin is warm and dry.  Neurological:     General: No focal deficit present.     Mental Status: She is alert and oriented to person, place, and time. Mental status is at baseline.  Psychiatric:        Mood and Affect: Mood normal.        Behavior: Behavior normal.        Thought Content: Thought content normal.        Judgment: Judgment normal.     Visual Acuity Right Eye Distance:   Left Eye Distance:   Bilateral Distance:    Right Eye Near:   Left Eye Near:    Bilateral Near:     UC Couse / Diagnostics / Procedures:     Radiology DG Abd 2 Views  Result Date: 05/20/2023 CLINICAL DATA:  Lower abdominal and back pain.  History of IBS. EXAM: ABDOMEN - 2 VIEW COMPARISON:  CT abdomen and pelvis 10/20/2018 FINDINGS: Gas is  present in the stomach and scattered loops of nondilated bowel without evidence of obstruction. No intraperitoneal free air is identified. No abnormal abdominal  or pelvic calcification is seen. The included lung bases are grossly clear. No acute osseous abnormality is identified. IMPRESSION: Negative. Electronically Signed   By: Sebastian Ache M.D.   On: 05/20/2023 14:12    Procedures Procedures (including critical care time) EKG  Pending results:  Labs Reviewed  POCT URINALYSIS DIP (MANUAL ENTRY) - Abnormal; Notable for the following components:      Result Value   Color, UA light yellow (*)    Spec Grav, UA <=1.005 (*)    Blood, UA trace-lysed (*)    All other components within normal limits  POCT URINE PREGNANCY    Medications Ordered in UC: Medications - No data to display  UC Diagnoses / Final Clinical Impressions(s)   I have reviewed the triage vital signs and the nursing notes.  Pertinent labs & imaging results that were available during my care of the patient were reviewed by me and considered in my medical decision making (see chart for details).    Final diagnoses:  Lower abdominal pain  History of IBS   Urine dip is normal, urine pregnancy test today was negative, abdominal x-ray was unremarkable.  Patient advised that given her history of constipation, she would benefit from bowel cleanout, instructions provided to patient in AVS.  Patient advised to go to the ED for worsening abdominal pain despite bowel cleanout and to follow-up with her gastroenterologist if symptoms persist.  Please see discharge instructions below for details of plan of care as provided to patient. ED Prescriptions   None    PDMP not reviewed this encounter.  Pending results:  Labs Reviewed  POCT URINALYSIS DIP (MANUAL ENTRY) - Abnormal; Notable for the following components:      Result Value   Color, UA light yellow (*)    Spec Grav, UA <=1.005 (*)    Blood, UA trace-lysed (*)    All other  components within normal limits  POCT URINE PREGNANCY    Discharge Instructions:   Discharge Instructions      Please purchase 32 ounces of your favorite flavor of Gatorade and mix in 250 g of MiraLAX, also known as polyethylene glycol.  It is not important to buy the brand name, the generic version will do.  When you have mixed them together and the MiraLAX powder is completely dissolved, please consume the entire 32 ounces within 1 hour.  Within a few hours, perhaps a little more or a little less, you should feel a significant urge to move your bowels and be able to produce a significant amount of stool.  If you feel you have had significant production of stool there is no need to repeat this process but if you feel that your results have been less than satisfying, you can certainly repeat this process 6 hours after the first round.       Disposition Upon Discharge:  Condition: stable for discharge home  Patient presented with an acute illness with associated systemic symptoms and significant discomfort requiring urgent management. In my opinion, this is a condition that a prudent lay person (someone who possesses an average knowledge of health and medicine) may potentially expect to result in complications if not addressed urgently such as respiratory distress, impairment of bodily function or dysfunction of bodily organs.   Routine symptom specific, illness specific and/or disease specific instructions were discussed with the patient and/or caregiver at length.   As such, the patient has been evaluated and assessed, work-up was performed and treatment was provided in  alignment with urgent care protocols and evidence based medicine.  Patient/parent/caregiver has been advised that the patient may require follow up for further testing and treatment if the symptoms continue in spite of treatment, as clinically indicated and appropriate.  Patient/parent/caregiver has been advised to  return to the [facility] or PCP if no better; to PCP or the Emergency Department if new signs and symptoms develop, or if the current signs or symptoms continue to change or worsen for further workup, evaluation and treatment as clinically indicated and appropriate  The patient will follow up with their current PCP if and as advised. If the patient does not currently have a PCP we will assist them in obtaining one.   The patient may need specialty follow up if the symptoms continue, in spite of conservative treatment and management, for further workup, evaluation, consultation and treatment as clinically indicated and appropriate.  Patient/parent/caregiver verbalized understanding and agreement of plan as discussed.  All questions were addressed during visit.  Please see discharge instructions below for further details of plan.  This office note has been dictated using Teaching laboratory technician.  Unfortunately, this method of dictation can sometimes lead to typographical or grammatical errors.  I apologize for your inconvenience in advance if this occurs.  Please do not hesitate to reach out to me if clarification is needed.      Theadora Rama Scales, PA-C 05/20/23 1427

## 2023-05-20 NOTE — ED Triage Notes (Signed)
Pt c/o onset of sharp pain to lower back and across lower abd that began this AM. Reports feeling need to have BM but was unable. Pt has hx of IBS. Denies any dysuria, but does endorse some urinary pressure. Denies n/v.

## 2023-05-20 NOTE — Discharge Instructions (Signed)
Please purchase 32 ounces of your favorite flavor of Gatorade and mix in 250 g of MiraLAX, also known as polyethylene glycol.  It is not important to buy the brand name, the generic version will do.  When you have mixed them together and the MiraLAX powder is completely dissolved, please consume the entire 32 ounces within 1 hour.  Within a few hours, perhaps a little more or a little less, you should feel a significant urge to move your bowels and be able to produce a significant amount of stool.  If you feel you have had significant production of stool there is no need to repeat this process but if you feel that your results have been less than satisfying, you can certainly repeat this process 6 hours after the first round.

## 2024-09-19 ENCOUNTER — Other Ambulatory Visit: Payer: Self-pay

## 2024-09-19 ENCOUNTER — Ambulatory Visit
Admission: EM | Admit: 2024-09-19 | Discharge: 2024-09-19 | Disposition: A | Discharge status: Home / Self Care | Attending: Internal Medicine | Admitting: Internal Medicine

## 2024-09-19 ENCOUNTER — Ambulatory Visit (INDEPENDENT_AMBULATORY_CARE_PROVIDER_SITE_OTHER)

## 2024-09-19 DIAGNOSIS — J209 Acute bronchitis, unspecified: Secondary | ICD-10-CM

## 2024-09-19 DIAGNOSIS — H1032 Unspecified acute conjunctivitis, left eye: Secondary | ICD-10-CM | POA: Diagnosis not present

## 2024-09-19 DIAGNOSIS — R051 Acute cough: Secondary | ICD-10-CM

## 2024-09-19 MED ORDER — PREDNISONE 20 MG PO TABS: 40.0000 mg | ORAL_TABLET | Freq: Every day | ORAL | 0 refills | Status: AC

## 2024-09-19 MED ORDER — ALBUTEROL SULFATE HFA 108 (90 BASE) MCG/ACT IN AERS: 2.0000 | INHALATION_SPRAY | Freq: Four times a day (QID) | RESPIRATORY_TRACT | 0 refills | Status: AC | PRN

## 2024-09-19 MED ORDER — BENZONATATE 200 MG PO CAPS: 200.0000 mg | ORAL_CAPSULE | Freq: Three times a day (TID) | ORAL | 0 refills | Status: AC | PRN

## 2024-09-19 MED ORDER — POLYMYXIN B-TRIMETHOPRIM 10000-0.1 UNIT/ML-% OP SOLN: 1.0000 [drp] | OPHTHALMIC | 0 refills | Status: AC

## 2024-09-19 MED ORDER — DOXYCYCLINE HYCLATE 100 MG PO CAPS: 100.0000 mg | ORAL_CAPSULE | Freq: Two times a day (BID) | ORAL | 0 refills | Status: AC

## 2024-09-19 NOTE — ED Provider Notes (Signed)
 [facility]  Note:  This document was prepared using Dragon voice recognition software and may include unintentional dictation errors.  MRN: [id number] DOB: [birth year] DATE: 09/19/24   Subjective:  Chief Complaint:  Chief Complaint  Patient presents with   Cough    HPI: Crystal Sheppard is a 33 y.o. female presenting for productive cough and congestion for the past 3-4 weeks. Patient states she saw her PCP on 08/30/2024 and was starting with cold like symptoms at that time. She also reports that she was having upper chest pain and upper back pain with shortness of breath. She was put on Augmentin at that time. She ended up going to the ER on 09/01/2024 for continued symptoms.  Workup at that time was unremarkable.  She states she was told that she possibly had costochondritis.  She states that despite finishing the Augmentin she continues to have a productive cough and nasal congestion. She states the shortness of breath and chest pain has improved, but still there. She became concerned when this morning she woke up and noticed her left eye was red with discharge. She has not taken anything for her symptoms. Denies fever, nausea/vomiting, abdominal pain, otalgia. Endorses cough, congestion, sore throat, eye redness, discharge. Presents NAD.  Prior to Admission medications   Medication Sig Start Date End Date Taking? Authorizing Provider  acetaminophen  (TYLENOL ) 500 MG tablet Take 1,000 mg by mouth every 6 (six) hours as needed for mild pain or headache.    [provider]  baclofen (LIORESAL) 10 MG tablet Take 10 mg by mouth 3 (three) times daily as needed.    [provider]  colestipol (COLESTID) 1 g tablet TAKE 1 TABLET BY MOUTH EVERY EVENING. TAKE 1 HOUR BEFORE OTHER MEDS OR TAKE 4 HOURS AFTER OTHER MEDS 05/22/22   [provider]  dicyclomine (BENTYL) 10 MG capsule TAKE 1 CAPSULE BY MOUTH 30 (THIRTY) MINUTES BEFORE MEALS. 05/22/22   [provider]   ibuprofen  (ADVIL ) 600 MG tablet Take 1 tablet (600 mg total) by mouth every 6 (six) hours. 01/21/21   Fogleman, Kelly, MD  linaclotide LARUE) 290 MCG CAPS capsule Take by mouth. 11/26/22 11/26/23  [provider]  magnesium  oxide (MAG-OX) 400 MG tablet Take 400 mg by mouth daily.    [provider]  metFORMIN (GLUCOPHAGE) 500 MG tablet Take by mouth. 06/06/22   [provider]  NIFEdipine  (ADALAT  CC) 30 MG 24 hr tablet Take 1 tablet (30 mg total) by mouth daily. 01/21/21   Kandyce Sor, MD  NURTEC 75 MG TBDP Take by mouth. 02/18/23   [provider]  rizatriptan (MAXALT) 10 MG tablet TAKE ONE TABLET ONCE AS NEEDED FOR MIGRAINE FOR UP TO 1 DOSE. MAY REPEAT IN 2 HOURS IF NEEDED. MAX 2/24 HOURS 03/06/22   [provider]  topiramate (TOPAMAX) 25 MG tablet Take 25 mg by mouth daily.    [provider]     Allergies  Allergen Reactions   Chlorzoxazone Itching and Rash    History:   Past Medical History:  Diagnosis Date   Dysmetabolic syndrome X    Hirsutism    IBS (irritable bowel syndrome)    Migraines    Other specified complication, antepartum(646.83)    headaches   Polycystic ovarian disease    Polycystic ovaries    Pregnancy induced hypertension      History reviewed. No pertinent surgical history.  Family History  Problem Relation Age of Onset   Asthma Mother  Hypertension Mother    Hypertension Brother    Asthma Brother    Diabetes Paternal Grandmother    Mental retardation Cousin        downs    Social History   Tobacco Use   Smoking status: Former   Smokeless tobacco: Former  Building Services Engineer status: Never Used  Substance Use Topics   Alcohol use: Not Currently    Comment: occasionally   Drug use: No    Review of Systems  Constitutional:  Negative for fever.  HENT:  Positive for congestion, rhinorrhea, sinus pressure and sore throat. Negative for ear pain.   Respiratory:  Positive for cough and  shortness of breath.   Cardiovascular:  Positive for chest pain.  Gastrointestinal:  Negative for abdominal pain, nausea and vomiting.  Musculoskeletal:  Positive for back pain and myalgias.     Objective:   Vitals: BP (!) 151/84 (BP Location: Right Arm)   Pulse 82   Temp 98.2 F (36.8 C) (Oral)   Resp 18   LMP 09/02/2024   SpO2 97%   Physical Exam Constitutional:      General: She is not in acute distress.    Appearance: Normal appearance. She is well-developed. She is obese. She is not ill-appearing or toxic-appearing.  HENT:     Head: Normocephalic and atraumatic.     Right Ear: Ear canal normal. A middle ear effusion is present.     Left Ear: Ear canal normal. A middle ear effusion is present.     Mouth/Throat:     Pharynx: Uvula midline. Posterior oropharyngeal erythema present. No pharyngeal swelling or oropharyngeal exudate.     Tonsils: No tonsillar exudate or tonsillar abscesses.  Cardiovascular:     Rate and Rhythm: Normal rate and regular rhythm.     Heart sounds: Normal heart sounds.  Pulmonary:     Effort: Pulmonary effort is normal.     Breath sounds: Normal breath sounds.     Comments: Clear to auscultation bilaterally  Abdominal:     General: Bowel sounds are normal.     Palpations: Abdomen is soft.     Tenderness: There is no abdominal tenderness.  Skin:    General: Skin is warm and dry.  Neurological:     General: No focal deficit present.     Mental Status: She is alert.  Psychiatric:        Mood and Affect: Mood and affect normal.     Results:  Labs: No results found for this or any previous visit (from the past 24 hours).  Radiology: DG Chest 2 View Result Date: 09/19/2024 CLINICAL DATA:  Cough. Symptoms for 4 weeks. Previous course of amoxicillin . EXAM: CHEST - 2 VIEW COMPARISON:  05/20/2016. FINDINGS: Normal heart, mediastinum and hila. Clear lungs.  No pleural effusion or pneumothorax. Skeletal structures are within normal limits.  IMPRESSION: Normal chest radiographs. Electronically Signed   By: Alm Parkins M.D.   On: 09/19/2024 15:36     UC Course/Treatments:  Procedures: Procedures   Medications Ordered in UC: Medications - No data to display   Assessment and Plan :     ICD-10-CM   1. Acute bronchitis, unspecified organism  J20.9     2. Acute cough  R05.1 DG Chest 2 View    DG Chest 2 View    3. Acute conjunctivitis of left eye, unspecified acute conjunctivitis type  H10.32      Acute Bronchitis, unspecified organism  Acute Cough Afebrile,  nontoxic-appearing, NAD. VSS. DDX includes but not limited to: COVID, flu, bronchitis, pneumonia, viral URI COVID and flu not ordered given length of symptoms. Imaging was negative for infiltrate. Given ongoing symptoms with worsening, Doxycycline 100 mg twice daily was prescribed for atypical bacterial bronchitis due to no improvement with Augmentin.  She was also prescribed prednisone 40 mg daily and Benzonatate 200mg  TID PRN was prescribed for cough. Albuterol inhaler 2 puffs every 6 hours PRN for shortness of breath or wheezing. Strict ED precautions were given and patient verbalized understanding.  Acute conjunctivitis of left eye, unspecified acute conjunctivitis type Afebrile, nontoxic-appearing, NAD. VSS. DDX includes but not limited to: Bacterial conjunctivitis, viral conjunctivitis, allergic conjunctivitis Left eye started with discharge and erythema this morning.  Given accompanying respiratory symptoms for the past 4 weeks, will treat with Polytrim 1 drop every 4 hours.  Recommend follow-up with ophthalmology if no improvement. Strict ED precautions were given and patient verbalized understanding.  ED Discharge Orders          Ordered    doxycycline (VIBRAMYCIN) 100 MG capsule  2 times daily        09/19/24 1532    benzonatate (TESSALON) 200 MG capsule  3 times daily PRN        09/19/24 1532    trimethoprim -polymyxin b (POLYTRIM) ophthalmic solution   Every 4 hours        09/19/24 1532    predniSONE (DELTASONE) 20 MG tablet  Daily        09/19/24 1532    albuterol (VENTOLIN HFA) 108 (90 Base) MCG/ACT inhaler  Every 6 hours PRN        09/19/24 1534             PDMP not reviewed this encounter.     Damean Poffenberger P, PA-C 09/19/24 1603

## 2024-09-19 NOTE — Discharge Instructions (Signed)
 A prescription was sent for doxycycline. This is an antibiotic used to treat respiratory symptoms. Take as directed.   You are also given a prescription for oral steroids to help with your cough and congestion as well as an inhaler to use as needed for any shortness of breath or chest tightness.  I have also sent a prescription for a cough medicine for you to take as well.   Finally, I did send an antibiotic drop for your eyes as well.  Please take as directed.  Return in 3-4 days if no improvement. It is very important for you to pay attention to any new symptoms or worsening of your current condition.  Please go directly to the Emergency Department immediately should you begin to have any of the following symptoms: shortness of breath, chest pain or difficulty breathing.

## 2024-09-19 NOTE — ED Triage Notes (Signed)
 C/O productive cough with green and red phlegm. C/O nasal congestion, left eye redness. Patient states symptoms for more then 4 weeks. Patient states she was seen at the emergency dept for same and took amoxicillin . Patient states symptoms are now worse. C/O ear fullness.
